# Patient Record
Sex: Female | Born: 1960 | Race: Black or African American | Hispanic: No | Smoking: Never smoker
Health system: Southern US, Community
[De-identification: ages and names within clinical notes are randomized; demographics above are authoritative.]

## PROBLEM LIST (undated history)

## (undated) DIAGNOSIS — N920 Excessive and frequent menstruation with regular cycle: Secondary | ICD-10-CM

## (undated) DIAGNOSIS — E785 Hyperlipidemia, unspecified: Secondary | ICD-10-CM

## (undated) DIAGNOSIS — I10 Essential (primary) hypertension: Secondary | ICD-10-CM

## (undated) DIAGNOSIS — E669 Obesity, unspecified: Secondary | ICD-10-CM

## (undated) DIAGNOSIS — E119 Type 2 diabetes mellitus without complications: Secondary | ICD-10-CM

## (undated) DIAGNOSIS — I809 Phlebitis and thrombophlebitis of unspecified site: Secondary | ICD-10-CM

## (undated) DIAGNOSIS — I8390 Asymptomatic varicose veins of unspecified lower extremity: Secondary | ICD-10-CM

## (undated) HISTORY — DX: Hyperlipidemia, unspecified: E78.5

## (undated) HISTORY — DX: Excessive and frequent menstruation with regular cycle: N92.0

## (undated) HISTORY — DX: Obesity, unspecified: E66.9

## (undated) HISTORY — DX: Phlebitis and thrombophlebitis of unspecified site: I80.9

## (undated) HISTORY — DX: Essential (primary) hypertension: I10

## (undated) HISTORY — DX: Asymptomatic varicose veins of unspecified lower extremity: I83.90

## (undated) HISTORY — DX: Type 2 diabetes mellitus without complications: E11.9

## (undated) HISTORY — PX: TOTAL ABDOMINAL HYSTERECTOMY: SHX209

---

## 1997-07-24 ENCOUNTER — Other Ambulatory Visit: Admission: RE | Admit: 1997-07-24 | Discharge: 1997-07-24 | Payer: Self-pay | Admitting: Obstetrics and Gynecology

## 1998-01-10 ENCOUNTER — Encounter: Payer: Self-pay | Admitting: Family Medicine

## 1998-01-10 ENCOUNTER — Ambulatory Visit (HOSPITAL_COMMUNITY): Admission: RE | Admit: 1998-01-10 | Discharge: 1998-01-10 | Payer: Self-pay | Admitting: Family Medicine

## 1998-11-12 ENCOUNTER — Encounter (INDEPENDENT_AMBULATORY_CARE_PROVIDER_SITE_OTHER): Payer: Self-pay

## 1998-11-12 ENCOUNTER — Other Ambulatory Visit: Admission: RE | Admit: 1998-11-12 | Discharge: 1998-11-12 | Payer: Self-pay | Admitting: Obstetrics and Gynecology

## 1998-12-12 ENCOUNTER — Encounter (INDEPENDENT_AMBULATORY_CARE_PROVIDER_SITE_OTHER): Payer: Self-pay

## 1998-12-12 ENCOUNTER — Inpatient Hospital Stay (HOSPITAL_COMMUNITY): Admission: RE | Admit: 1998-12-12 | Discharge: 1998-12-14 | Payer: Self-pay | Admitting: Obstetrics and Gynecology

## 1999-02-09 ENCOUNTER — Encounter: Admission: RE | Admit: 1999-02-09 | Discharge: 1999-05-10 | Payer: Self-pay | Admitting: Family Medicine

## 2006-07-27 ENCOUNTER — Encounter: Admission: RE | Admit: 2006-07-27 | Discharge: 2006-07-27 | Payer: Self-pay | Admitting: Internal Medicine

## 2007-08-18 ENCOUNTER — Encounter: Admission: RE | Admit: 2007-08-18 | Discharge: 2007-08-18 | Payer: Self-pay | Admitting: Internal Medicine

## 2008-09-18 ENCOUNTER — Encounter: Admission: RE | Admit: 2008-09-18 | Discharge: 2008-09-18 | Payer: Self-pay | Admitting: Internal Medicine

## 2009-10-06 ENCOUNTER — Encounter: Admission: RE | Admit: 2009-10-06 | Discharge: 2009-10-06 | Payer: Self-pay | Admitting: Internal Medicine

## 2010-03-15 ENCOUNTER — Encounter: Payer: Self-pay | Admitting: Internal Medicine

## 2010-09-15 ENCOUNTER — Other Ambulatory Visit: Payer: Self-pay | Admitting: Internal Medicine

## 2010-09-15 DIAGNOSIS — Z1231 Encounter for screening mammogram for malignant neoplasm of breast: Secondary | ICD-10-CM

## 2010-10-13 ENCOUNTER — Ambulatory Visit
Admission: RE | Admit: 2010-10-13 | Discharge: 2010-10-13 | Disposition: A | Discharge status: Home / Self Care | Payer: 59 | Source: Ambulatory Visit | Attending: Internal Medicine | Admitting: Internal Medicine

## 2010-10-13 DIAGNOSIS — Z1231 Encounter for screening mammogram for malignant neoplasm of breast: Secondary | ICD-10-CM

## 2011-09-27 ENCOUNTER — Other Ambulatory Visit: Payer: Self-pay | Admitting: Internal Medicine

## 2011-09-27 DIAGNOSIS — Z1231 Encounter for screening mammogram for malignant neoplasm of breast: Secondary | ICD-10-CM

## 2011-10-14 ENCOUNTER — Ambulatory Visit: Payer: 59

## 2011-11-04 ENCOUNTER — Ambulatory Visit
Admission: RE | Admit: 2011-11-04 | Discharge: 2011-11-04 | Disposition: A | Discharge status: Home / Self Care | Payer: BC Managed Care – PPO | Source: Ambulatory Visit | Attending: Internal Medicine | Admitting: Internal Medicine

## 2011-11-04 DIAGNOSIS — Z1231 Encounter for screening mammogram for malignant neoplasm of breast: Secondary | ICD-10-CM

## 2012-02-08 ENCOUNTER — Other Ambulatory Visit (HOSPITAL_COMMUNITY): Payer: Self-pay | Admitting: Internal Medicine

## 2012-02-08 DIAGNOSIS — I809 Phlebitis and thrombophlebitis of unspecified site: Secondary | ICD-10-CM

## 2012-02-08 DIAGNOSIS — I8390 Asymptomatic varicose veins of unspecified lower extremity: Secondary | ICD-10-CM

## 2012-02-28 ENCOUNTER — Encounter (HOSPITAL_COMMUNITY): Payer: BC Managed Care – PPO

## 2012-03-08 ENCOUNTER — Ambulatory Visit (HOSPITAL_COMMUNITY)
Admission: RE | Admit: 2012-03-08 | Discharge: 2012-03-08 | Disposition: A | Discharge status: Home / Self Care | Payer: BC Managed Care – PPO | Source: Ambulatory Visit | Attending: Internal Medicine | Admitting: Internal Medicine

## 2012-03-08 DIAGNOSIS — I809 Phlebitis and thrombophlebitis of unspecified site: Secondary | ICD-10-CM | POA: Insufficient documentation

## 2012-03-08 DIAGNOSIS — I8390 Asymptomatic varicose veins of unspecified lower extremity: Secondary | ICD-10-CM

## 2012-03-08 NOTE — Progress Notes (Signed)
BLE venous duplex doppler completed for venous insufficiency. Positive for left GSV VI. Chauncy Lean

## 2012-08-31 ENCOUNTER — Other Ambulatory Visit: Payer: Self-pay | Admitting: Internal Medicine

## 2012-08-31 DIAGNOSIS — Z1231 Encounter for screening mammogram for malignant neoplasm of breast: Secondary | ICD-10-CM

## 2012-10-03 ENCOUNTER — Other Ambulatory Visit: Payer: Self-pay

## 2012-10-03 DIAGNOSIS — Z1231 Encounter for screening mammogram for malignant neoplasm of breast: Secondary | ICD-10-CM

## 2012-11-07 ENCOUNTER — Ambulatory Visit
Admission: RE | Admit: 2012-11-07 | Discharge: 2012-11-07 | Disposition: A | Discharge status: Home / Self Care | Payer: BC Managed Care – PPO | Source: Ambulatory Visit

## 2012-11-07 DIAGNOSIS — Z1231 Encounter for screening mammogram for malignant neoplasm of breast: Secondary | ICD-10-CM

## 2013-10-04 ENCOUNTER — Other Ambulatory Visit: Payer: Self-pay

## 2013-10-04 DIAGNOSIS — Z1231 Encounter for screening mammogram for malignant neoplasm of breast: Secondary | ICD-10-CM

## 2013-11-08 ENCOUNTER — Ambulatory Visit: Payer: BC Managed Care – PPO

## 2013-12-03 ENCOUNTER — Ambulatory Visit
Admission: RE | Admit: 2013-12-03 | Discharge: 2013-12-03 | Disposition: A | Discharge status: Home / Self Care | Payer: Commercial Managed Care - PPO | Source: Ambulatory Visit

## 2013-12-03 DIAGNOSIS — Z1231 Encounter for screening mammogram for malignant neoplasm of breast: Secondary | ICD-10-CM

## 2014-09-05 ENCOUNTER — Other Ambulatory Visit: Payer: Self-pay

## 2014-10-04 ENCOUNTER — Encounter: Payer: Self-pay | Admitting: Internal Medicine

## 2014-11-27 ENCOUNTER — Other Ambulatory Visit: Payer: Self-pay

## 2014-11-27 DIAGNOSIS — Z1231 Encounter for screening mammogram for malignant neoplasm of breast: Secondary | ICD-10-CM

## 2014-12-25 ENCOUNTER — Ambulatory Visit: Payer: Commercial Managed Care - PPO

## 2014-12-26 ENCOUNTER — Ambulatory Visit
Admission: RE | Admit: 2014-12-26 | Discharge: 2014-12-26 | Disposition: A | Discharge status: Home / Self Care | Payer: Commercial Managed Care - PPO | Source: Ambulatory Visit

## 2014-12-26 DIAGNOSIS — Z1231 Encounter for screening mammogram for malignant neoplasm of breast: Secondary | ICD-10-CM

## 2015-12-01 ENCOUNTER — Other Ambulatory Visit: Payer: Self-pay | Admitting: Internal Medicine

## 2015-12-01 DIAGNOSIS — Z1231 Encounter for screening mammogram for malignant neoplasm of breast: Secondary | ICD-10-CM

## 2015-12-29 ENCOUNTER — Ambulatory Visit
Admission: RE | Admit: 2015-12-29 | Discharge: 2015-12-29 | Disposition: A | Discharge status: Home / Self Care | Payer: Commercial Managed Care - PPO | Source: Ambulatory Visit | Attending: Internal Medicine | Admitting: Internal Medicine

## 2015-12-29 DIAGNOSIS — Z1231 Encounter for screening mammogram for malignant neoplasm of breast: Secondary | ICD-10-CM

## 2016-03-02 DIAGNOSIS — N951 Menopausal and female climacteric states: Secondary | ICD-10-CM | POA: Diagnosis not present

## 2016-03-02 DIAGNOSIS — I10 Essential (primary) hypertension: Secondary | ICD-10-CM | POA: Diagnosis not present

## 2016-03-02 DIAGNOSIS — E1165 Type 2 diabetes mellitus with hyperglycemia: Secondary | ICD-10-CM | POA: Diagnosis not present

## 2016-05-06 DIAGNOSIS — B372 Candidiasis of skin and nail: Secondary | ICD-10-CM | POA: Diagnosis not present

## 2016-05-17 DIAGNOSIS — R001 Bradycardia, unspecified: Secondary | ICD-10-CM | POA: Diagnosis not present

## 2016-05-17 DIAGNOSIS — I119 Hypertensive heart disease without heart failure: Secondary | ICD-10-CM | POA: Diagnosis not present

## 2016-05-17 DIAGNOSIS — I080 Rheumatic disorders of both mitral and aortic valves: Secondary | ICD-10-CM | POA: Diagnosis not present

## 2016-06-08 DIAGNOSIS — E1165 Type 2 diabetes mellitus with hyperglycemia: Secondary | ICD-10-CM | POA: Diagnosis not present

## 2016-06-08 DIAGNOSIS — N951 Menopausal and female climacteric states: Secondary | ICD-10-CM | POA: Diagnosis not present

## 2016-06-08 DIAGNOSIS — I10 Essential (primary) hypertension: Secondary | ICD-10-CM | POA: Diagnosis not present

## 2016-08-26 DIAGNOSIS — L603 Nail dystrophy: Secondary | ICD-10-CM | POA: Diagnosis not present

## 2016-08-26 DIAGNOSIS — B351 Tinea unguium: Secondary | ICD-10-CM | POA: Diagnosis not present

## 2016-10-21 DIAGNOSIS — I10 Essential (primary) hypertension: Secondary | ICD-10-CM | POA: Diagnosis not present

## 2016-10-21 DIAGNOSIS — Z0000 Encounter for general adult medical examination without abnormal findings: Secondary | ICD-10-CM | POA: Diagnosis not present

## 2016-10-21 DIAGNOSIS — E1165 Type 2 diabetes mellitus with hyperglycemia: Secondary | ICD-10-CM | POA: Diagnosis not present

## 2016-11-02 DIAGNOSIS — Z8371 Family history of colonic polyps: Secondary | ICD-10-CM | POA: Diagnosis not present

## 2016-11-02 DIAGNOSIS — R194 Change in bowel habit: Secondary | ICD-10-CM | POA: Diagnosis not present

## 2016-11-16 DIAGNOSIS — K5904 Chronic idiopathic constipation: Secondary | ICD-10-CM | POA: Diagnosis not present

## 2016-11-30 ENCOUNTER — Other Ambulatory Visit: Payer: Self-pay | Admitting: Internal Medicine

## 2016-11-30 DIAGNOSIS — Z1231 Encounter for screening mammogram for malignant neoplasm of breast: Secondary | ICD-10-CM

## 2016-12-27 DIAGNOSIS — H2513 Age-related nuclear cataract, bilateral: Secondary | ICD-10-CM | POA: Diagnosis not present

## 2016-12-27 DIAGNOSIS — E119 Type 2 diabetes mellitus without complications: Secondary | ICD-10-CM | POA: Diagnosis not present

## 2016-12-27 DIAGNOSIS — H31091 Other chorioretinal scars, right eye: Secondary | ICD-10-CM | POA: Diagnosis not present

## 2016-12-29 ENCOUNTER — Ambulatory Visit: Payer: Commercial Managed Care - PPO

## 2017-01-05 ENCOUNTER — Ambulatory Visit
Admission: RE | Admit: 2017-01-05 | Discharge: 2017-01-05 | Disposition: A | Discharge status: Home / Self Care | Payer: Commercial Managed Care - PPO | Source: Ambulatory Visit | Attending: Internal Medicine | Admitting: Internal Medicine

## 2017-01-05 DIAGNOSIS — Z1231 Encounter for screening mammogram for malignant neoplasm of breast: Secondary | ICD-10-CM | POA: Diagnosis not present

## 2017-02-24 DIAGNOSIS — E785 Hyperlipidemia, unspecified: Secondary | ICD-10-CM | POA: Diagnosis not present

## 2017-02-24 DIAGNOSIS — E1165 Type 2 diabetes mellitus with hyperglycemia: Secondary | ICD-10-CM | POA: Diagnosis not present

## 2017-02-24 DIAGNOSIS — I10 Essential (primary) hypertension: Secondary | ICD-10-CM | POA: Diagnosis not present

## 2017-02-28 DIAGNOSIS — I119 Hypertensive heart disease without heart failure: Secondary | ICD-10-CM | POA: Diagnosis not present

## 2017-02-28 DIAGNOSIS — I080 Rheumatic disorders of both mitral and aortic valves: Secondary | ICD-10-CM | POA: Diagnosis not present

## 2017-02-28 DIAGNOSIS — R001 Bradycardia, unspecified: Secondary | ICD-10-CM | POA: Diagnosis not present

## 2017-03-16 DIAGNOSIS — K5900 Constipation, unspecified: Secondary | ICD-10-CM | POA: Diagnosis not present

## 2017-03-16 DIAGNOSIS — R195 Other fecal abnormalities: Secondary | ICD-10-CM | POA: Diagnosis not present

## 2017-03-16 DIAGNOSIS — Z8371 Family history of colonic polyps: Secondary | ICD-10-CM | POA: Diagnosis not present

## 2017-03-16 DIAGNOSIS — Z86010 Personal history of colonic polyps: Secondary | ICD-10-CM | POA: Diagnosis not present

## 2017-05-13 DIAGNOSIS — D122 Benign neoplasm of ascending colon: Secondary | ICD-10-CM | POA: Diagnosis not present

## 2017-05-13 DIAGNOSIS — Z86010 Personal history of colonic polyps: Secondary | ICD-10-CM | POA: Diagnosis not present

## 2017-05-13 DIAGNOSIS — D123 Benign neoplasm of transverse colon: Secondary | ICD-10-CM | POA: Diagnosis not present

## 2017-05-31 DIAGNOSIS — I10 Essential (primary) hypertension: Secondary | ICD-10-CM | POA: Diagnosis not present

## 2017-05-31 DIAGNOSIS — E1165 Type 2 diabetes mellitus with hyperglycemia: Secondary | ICD-10-CM | POA: Diagnosis not present

## 2017-08-02 DIAGNOSIS — G4700 Insomnia, unspecified: Secondary | ICD-10-CM | POA: Diagnosis not present

## 2017-08-02 DIAGNOSIS — N951 Menopausal and female climacteric states: Secondary | ICD-10-CM | POA: Diagnosis not present

## 2017-08-02 DIAGNOSIS — Z1389 Encounter for screening for other disorder: Secondary | ICD-10-CM | POA: Diagnosis not present

## 2017-09-08 DIAGNOSIS — E1165 Type 2 diabetes mellitus with hyperglycemia: Secondary | ICD-10-CM | POA: Diagnosis not present

## 2017-09-08 DIAGNOSIS — I10 Essential (primary) hypertension: Secondary | ICD-10-CM | POA: Diagnosis not present

## 2017-09-08 DIAGNOSIS — N951 Menopausal and female climacteric states: Secondary | ICD-10-CM | POA: Diagnosis not present

## 2017-09-08 LAB — BASIC METABOLIC PANEL
BUN: 11 (ref 4–21)
Creatinine: 0.8 (ref 0.5–1.1)
Glucose: 85
Potassium: 4.2 (ref 3.4–5.3)
Sodium: 143 (ref 137–147)

## 2017-09-08 LAB — HEMOGLOBIN A1C: Hemoglobin A1C: 6.6

## 2017-11-28 ENCOUNTER — Other Ambulatory Visit: Payer: Self-pay | Admitting: Internal Medicine

## 2017-11-28 DIAGNOSIS — Z1231 Encounter for screening mammogram for malignant neoplasm of breast: Secondary | ICD-10-CM

## 2017-12-12 ENCOUNTER — Encounter: Payer: Self-pay | Admitting: Internal Medicine

## 2017-12-12 DIAGNOSIS — I10 Essential (primary) hypertension: Secondary | ICD-10-CM | POA: Insufficient documentation

## 2017-12-15 ENCOUNTER — Encounter: Payer: Self-pay | Admitting: Internal Medicine

## 2017-12-15 ENCOUNTER — Ambulatory Visit (INDEPENDENT_AMBULATORY_CARE_PROVIDER_SITE_OTHER): Payer: Commercial Managed Care - PPO | Admitting: Internal Medicine

## 2017-12-15 VITALS — BP 126/88 | HR 56 | Temp 98.3°F | Ht 63.25 in | Wt 218.8 lb

## 2017-12-15 DIAGNOSIS — Z7982 Long term (current) use of aspirin: Secondary | ICD-10-CM | POA: Diagnosis not present

## 2017-12-15 DIAGNOSIS — E1165 Type 2 diabetes mellitus with hyperglycemia: Secondary | ICD-10-CM | POA: Diagnosis not present

## 2017-12-15 DIAGNOSIS — I10 Essential (primary) hypertension: Secondary | ICD-10-CM | POA: Diagnosis not present

## 2017-12-15 DIAGNOSIS — Z0000 Encounter for general adult medical examination without abnormal findings: Secondary | ICD-10-CM

## 2017-12-15 LAB — POCT URINALYSIS DIPSTICK
Bilirubin, UA: NEGATIVE
Glucose, UA: NEGATIVE
Ketones, UA: NEGATIVE
Protein, UA: NEGATIVE
Spec Grav, UA: 1.015 (ref 1.010–1.025)
Urobilinogen, UA: 0.2 E.U./dL
pH, UA: 7 (ref 5.0–8.0)

## 2017-12-15 LAB — POCT UA - MICROALBUMIN
Albumin/Creatinine Ratio, Urine, POC: 30
Creatinine, POC: 100 mg/dL
Microalbumin Ur, POC: 10 mg/L

## 2017-12-15 NOTE — Progress Notes (Signed)
Subjective:     Patient ID: Katelyn Schmidt , female    DOB: 19-May-1960 , 57 y.o.   MRN: [id number]  No LMP recorded. Patient is postmenopausal..  Negative for: breast discharge, breast lump(s), breast pain and breast self exam. Associated symptoms include abnormal vaginal bleeding. Pertinent negatives include abnormal bleeding (hematology), anxiety, decreased libido, depression, difficulty falling sleep, dyspareunia, history of infertility, nocturia, sexual dysfunction, sleep disturbances, urinary incontinence, urinary urgency, vaginal discharge and vaginal itching. Diet regular.The patient states her exercise level is  MINIMAL.  . The patient's tobacco use is:  Social History   Tobacco Use  Smoking Status Never Smoker  Smokeless Tobacco Never Used  . She has been exposed to passive smoke. The patient's alcohol use is:  Social History   Substance and Sexual Activity  Alcohol Use No   SHE IS HERE TODAY FOR A FULL PHYSICAL EXAMINATION. SHE HAS NO SPECIFIC CONCERNS OR COMPLAINTS AT THIS TIME.   Diabetes  She presents for her follow-up diabetic visit. She has type 2 diabetes mellitus. There are no hypoglycemic associated symptoms. There are no diabetic associated symptoms. Weakness:  There are no hypoglycemic complications. Symptoms are stable. Risk factors for coronary artery disease include diabetes mellitus, dyslipidemia, hypertension, post-menopausal and sedentary lifestyle. She is compliant with treatment most of the time.  Hypertension  This is a chronic problem. The current episode started more than 1 year ago. The problem has been gradually improving since onset. The problem is controlled.   SHE REPORTS COMPLIANCE WITH MEDICATIONS.  Past Medical History:  Diagnosis Date  . Diabetes mellitus, type 2 ([place])   . Dyslipidemia   . Hypertension   . Menorrhagia    fibroids  . Obesity   . Phlebitis   . Varicose veins       Current Outpatient Medications:  .  amLODipine (NORVASC) 10  MG tablet, Take 10 mg by mouth daily., Disp: , Rfl:  .  aspirin 81 MG tablet, Take 81 mg by mouth daily., Disp: , Rfl:  .  Cholecalciferol (VITAMIN D-3 PO), Take 1 tablet by mouth daily., Disp: , Rfl:  .  metFORMIN (GLUCOPHAGE) 500 MG tablet, Take 500 mg by mouth 2 (two) times daily with a meal. , Disp: , Rfl:  .  Multiple Vitamins-Minerals (MULTIVITAMIN PO), Take 1 tablet by mouth daily., Disp: , Rfl:  .  Progesterone 50 MG SUPP, Place 1 suppository vaginally at bedtime., Disp: , Rfl:  .  rosuvastatin (CRESTOR) 10 MG tablet, Take 10 mg by mouth daily., Disp: , Rfl:  .  telmisartan-hydrochlorothiazide (MICARDIS HCT) 80-25 MG tablet, Take 1 tablet by mouth daily., Disp: , Rfl:  .  vitamin C (ASCORBIC ACID) 500 MG tablet, Take 500 mg by mouth daily., Disp: , Rfl:    No Known Allergies   Review of Systems  Constitutional: Negative.   HENT: Negative.   Eyes: Negative.   Respiratory: Negative.   Cardiovascular: Negative.   Gastrointestinal: Negative.   Endocrine: Negative.   Genitourinary: Negative.   Musculoskeletal: Negative.   Skin: Negative.   Allergic/Immunologic: Negative.   Neurological: Negative.  Weakness:   Hematological: Negative.   Psychiatric/Behavioral: Negative.      Today's Vitals   12/15/17 0934  BP: 126/88  Pulse: (!) 56  Temp: 98.3 F (36.8 C)  TempSrc: Oral  Weight: 218 lb 12.8 oz (99.2 kg)  Height: 5' 3.25" (1.607 m)   Body mass index is 38.45 kg/m.   Objective:  Physical Exam  Constitutional: She is  oriented to person, place, and time. She appears well-developed and well-nourished.  HENT:  Head: Normocephalic and atraumatic.  Right Ear: External ear normal.  Left Ear: External ear normal.  Nose: Nose normal.  Mouth/Throat: Oropharynx is clear and moist.  Eyes: Pupils are equal, round, and reactive to light. Conjunctivae and EOM are normal.  Neck: Normal range of motion. Neck supple.  Cardiovascular: Normal rate, regular rhythm, normal heart sounds  and intact distal pulses.  Pulses:      Dorsalis pedis pulses are 2+ on the right side, and 2+ on the left side.       Posterior tibial pulses are 2+ on the right side, and 2+ on the left side.  Pulmonary/Chest: Effort normal and breath sounds normal. Right breast exhibits no inverted nipple, no mass, no nipple discharge, no skin change and no tenderness. Left breast exhibits no inverted nipple, no mass, no nipple discharge, no skin change and no tenderness.  Abdominal: Soft. Bowel sounds are normal.  Genitourinary:  Genitourinary Comments: deferred  Musculoskeletal: Normal range of motion.  Feet:  Right Foot:  Protective Sensation: 5 sites tested. 5 sites sensed.  Skin Integrity: Positive for callus.  Left Foot:  Protective Sensation: 5 sites tested. 5 sites sensed.  Skin Integrity: Positive for callus.  Neurological: She is alert and oriented to person, place, and time.  Skin: Skin is warm and dry.  Psychiatric: She has a normal mood and affect. Her behavior is normal.  Nursing note and vitals reviewed.       Assessment And Plan:     1. Routine general medical examination at health care facility  A FULL EXAM WAS PERFORMED. IMPORTANCE OF MONTHLY SELF BREAST EXAMS WAS DISCUSSED WITH THE PATIENT.  PATIENT HAS BEEN ADVISED TO GET 30-45 MINUTES REGULAR EXERCISE NO LESS THAN FOUR TO FIVE DAYS PER WEEK - BOTH WEIGHTBEARING EXERCISES AND AEROBIC ARE RECOMMENDED.  SHE IS ADVISED TO FOLLOW A HEALTHY DIET WITH AT LEAST SIX FRUITS/VEGGIES PER DAY, DECREASE INTAKE OF RED MEAT, AND TO INCREASE FISH INTAKE TO TWO DAYS PER WEEK.  MEATS/FISH SHOULD NOT BE FRIED, BAKED OR BROILED IS PREFERABLE.  I SUGGEST WEARING SPF 50 SUNSCREEN ON EXPOSED PARTS AND ESPECIALLY WHEN IN THE DIRECT SUNLIGHT FOR AN EXTENDED PERIOD OF TIME.  PLEASE AVOID FAST FOOD RESTAURANTS AND INCREASE YOUR WATER INTAKE.   2. Uncontrolled type 2 diabetes mellitus with hyperglycemia ([facility])  Diabetic foot exam was performed.  IT IS  IMPORTANT TO MAINTAIN OPTIMAL BLOOD SUGAR CONTROL TO REDUCE YOUR RISK OF SERIOUS DIABETES COMPLICATIONS INCLUDING EYE AND KIDNEY DISEASE.  BLOOD SUGARS SHOULD BE LESS THAN 110 BEFORE MEALS AND LESS THAN 140 TWO HOURS AFTER MEALS.  PLEASE TEST BS THREE TIMES DAILY. BRING YOUR BLOOD SUGAR READINGS FOR REVIEW.  PLEASE MAINTAIN A HEALTHY, WELL BALANCED DIET FREE OF PROCESSED FOODS AND REFINED SUGARS.  MAINTAIN YOUR REGULAR EXERCISE REGIMEN.  PLS NOTIFY OFFICE FOR UNCONTROLLED NAUSEA, VOMITING, DIARRHEA, POOR ORAL INTAKE AND FOR CONSISTENT BS READINGS ABOVE 350.   3. Essential hypertension  WELL CONTROLLED. SHE WILL CONTINUE WITH CURRENT MEDS. SHE IS ENCOURAGED TO LIMIT HER SALT INTAKE.    - POCT Urinalysis Dipstick (03559) - POCT UA - Microalbumin        Maximino Greenland, MD

## 2017-12-16 LAB — CMP14+EGFR
ALT: 18 IU/L (ref 0–32)
AST: 15 IU/L (ref 0–40)
Albumin/Globulin Ratio: 1.7 (ref 1.2–2.2)
Albumin: 4.3 g/dL (ref 3.5–5.5)
Alkaline Phosphatase: 96 IU/L (ref 39–117)
BUN/Creatinine Ratio: 13 (ref 9–23)
BUN: 11 mg/dL (ref 6–24)
Bilirubin Total: 0.4 mg/dL (ref 0.0–1.2)
CO2: 24 mmol/L (ref 20–29)
Calcium: 10.6 mg/dL — ABNORMAL HIGH (ref 8.7–10.2)
Chloride: 104 mmol/L (ref 96–106)
Creatinine, Ser: 0.85 mg/dL (ref 0.57–1.00)
GFR calc Af Amer: 89 mL/min/{1.73_m2} (ref >59)
GFR calc non Af Amer: 77 mL/min/{1.73_m2} (ref >59)
Globulin, Total: 2.6 g/dL (ref 1.5–4.5)
Glucose: 105 mg/dL — ABNORMAL HIGH (ref 65–99)
Potassium: 4.1 mmol/L (ref 3.5–5.2)
Sodium: 142 mmol/L (ref 134–144)
Total Protein: 6.9 g/dL (ref 6.0–8.5)

## 2017-12-16 LAB — CBC WITH DIFFERENTIAL/PLATELET
Basophils Absolute: 0.1 10*3/uL (ref 0.0–0.2)
Basos: 1 %
EOS (ABSOLUTE): 0.2 10*3/uL (ref 0.0–0.4)
Eos: 4 %
Hematocrit: 44.3 % (ref 34.0–46.6)
Hemoglobin: 13.8 g/dL (ref 11.1–15.9)
Immature Grans (Abs): 0 10*3/uL (ref 0.0–0.1)
Immature Granulocytes: 1 %
Lymphocytes Absolute: 2.5 10*3/uL (ref 0.7–3.1)
Lymphs: 37 %
MCH: 25 pg — ABNORMAL LOW (ref 26.6–33.0)
MCHC: 31.2 g/dL — ABNORMAL LOW (ref 31.5–35.7)
MCV: 80 fL (ref 79–97)
Monocytes Absolute: 0.6 10*3/uL (ref 0.1–0.9)
Monocytes: 9 %
Neutrophils Absolute: 3.1 10*3/uL (ref 1.4–7.0)
Neutrophils: 48 %
Platelets: 319 10*3/uL (ref 150–450)
RBC: 5.53 x10E6/uL — ABNORMAL HIGH (ref 3.77–5.28)
RDW: 14.7 % (ref 12.3–15.4)
WBC: 6.6 10*3/uL (ref 3.4–10.8)

## 2017-12-16 LAB — LIPID PANEL
Chol/HDL Ratio: 3.2 ratio (ref 0.0–4.4)
Cholesterol, Total: 144 mg/dL (ref 100–199)
HDL: 45 mg/dL (ref >39)
LDL Calculated: 79 mg/dL (ref 0–99)
Triglycerides: 100 mg/dL (ref 0–149)
VLDL Cholesterol Cal: 20 mg/dL (ref 5–40)

## 2017-12-16 LAB — TSH: TSH: 1.68 u[IU]/mL (ref 0.450–4.500)

## 2017-12-16 LAB — HEMOGLOBIN A1C
Est. average glucose Bld gHb Est-mCnc: 143 mg/dL
Hgb A1c MFr Bld: 6.6 % — ABNORMAL HIGH (ref 4.8–5.6)

## 2017-12-18 ENCOUNTER — Encounter: Payer: Self-pay | Admitting: Internal Medicine

## 2017-12-18 DIAGNOSIS — E669 Obesity, unspecified: Secondary | ICD-10-CM | POA: Insufficient documentation

## 2017-12-18 DIAGNOSIS — E1165 Type 2 diabetes mellitus with hyperglycemia: Secondary | ICD-10-CM | POA: Insufficient documentation

## 2017-12-18 NOTE — Progress Notes (Signed)
Your blood count is normal. Your liver and kidney function are normal. Your calcium levels are slightly elevated. Your hba1c is 6.6. 6, this is great. Your thyroid function is normal. Your cholesterol is great. Please incorporate more exercise into your daily routine.

## 2017-12-19 NOTE — Addendum Note (Signed)
Addended by: Maximino Greenland on: 12/19/2017 06:43 PM   Modules accepted: Orders

## 2017-12-28 DIAGNOSIS — H2513 Age-related nuclear cataract, bilateral: Secondary | ICD-10-CM | POA: Diagnosis not present

## 2017-12-28 DIAGNOSIS — H31091 Other chorioretinal scars, right eye: Secondary | ICD-10-CM | POA: Diagnosis not present

## 2017-12-28 DIAGNOSIS — E119 Type 2 diabetes mellitus without complications: Secondary | ICD-10-CM | POA: Diagnosis not present

## 2017-12-28 LAB — HM DIABETES EYE EXAM

## 2018-01-09 ENCOUNTER — Ambulatory Visit
Admission: RE | Admit: 2018-01-09 | Discharge: 2018-01-09 | Disposition: A | Discharge status: Home / Self Care | Payer: Commercial Managed Care - PPO | Source: Ambulatory Visit | Attending: Internal Medicine | Admitting: Internal Medicine

## 2018-01-09 DIAGNOSIS — Z1231 Encounter for screening mammogram for malignant neoplasm of breast: Secondary | ICD-10-CM | POA: Diagnosis not present

## 2018-02-27 ENCOUNTER — Other Ambulatory Visit: Payer: Self-pay | Admitting: Internal Medicine

## 2018-03-27 ENCOUNTER — Other Ambulatory Visit: Payer: Commercial Managed Care - PPO

## 2018-03-27 ENCOUNTER — Ambulatory Visit: Payer: Commercial Managed Care - PPO

## 2018-03-28 ENCOUNTER — Encounter: Payer: Self-pay | Admitting: Internal Medicine

## 2018-03-28 ENCOUNTER — Ambulatory Visit: Payer: Commercial Managed Care - PPO | Admitting: Internal Medicine

## 2018-03-28 VITALS — BP 102/80 | HR 55 | Temp 98.1°F | Ht 63.0 in | Wt 227.0 lb

## 2018-03-28 DIAGNOSIS — R319 Hematuria, unspecified: Secondary | ICD-10-CM | POA: Diagnosis not present

## 2018-03-28 LAB — POCT URINALYSIS DIPSTICK
Bilirubin, UA: NEGATIVE
Glucose, UA: NEGATIVE
Ketones, UA: NEGATIVE
Leukocytes, UA: NEGATIVE
Nitrite, UA: NEGATIVE
Protein, UA: NEGATIVE
Spec Grav, UA: 1.02 (ref 1.010–1.025)
Urobilinogen, UA: 0.2 E.U./dL
pH, UA: 6 (ref 5.0–8.0)

## 2018-03-28 MED ORDER — NITROFURANTOIN MONOHYD MACRO 100 MG PO CAPS: 100.0000 mg | ORAL_CAPSULE | Freq: Two times a day (BID) | ORAL | 0 refills | Status: DC

## 2018-03-28 NOTE — Patient Instructions (Signed)
Hemorrhagic Cystitis  Hemorrhagic cystitis is bleeding from damage to the inner lining of the bladder. This condition results when the inner lining of the bladder (transitional epithelium) is damaged along with the blood vessels that supply the area. Hemorrhagic cystitis may make it difficult or painful to pass urine.  What are the causes?  This condition may be caused by:  · Damage from certain cancer treatments. This is the most common cause. It can result from:  ? Radiation treatment that involves the bladder.  ? Chemotherapy drugs used to treat certain cancers or to treat people who have a bone marrow transplant (cyclophosphamide and ifosfamide).  · Infections with bacteria or viruses, especially in people with a weak body defense system (immune system).  Rare causes of the condition include:  · Other drugs, including penicillin drugs and a type of steroid (danazol).  · Exposure to toxic chemicals used in dyes, markers, shoe polish, or pesticides.  What are the signs or symptoms?  The main sign of this condition is blood in the urine (hematuria). This can range from very mild to severe.  · Mild hematuria can include microscopic bleeding that does not change the color of your urine.  · Severe hematuria can cause you to have urine with bright red blood or blood clots in it. In some cases, severe hematuria can cause large clots that fill the bladder and block urine flow (urinary obstruction).  Hemorrhagic cystitis may also cause symptoms such as:  · An urgent or frequent need to pass urine.  · Pain when passing urine.  · Lower belly pain and fullness.  · Urinary obstruction.  How is this diagnosed?  This condition may be diagnosed based on:  · Your symptoms and medical history.  · A physical exam.  · Tests, such as:  ? Urine tests to check for blood or signs of infection.  ? Blood tests to check for signs of infection and a low red blood cell count due to bleeding.  ? Imaging tests of the bladder, such as  ultrasound, CT scan, or MRI.  ? A procedure to examine the inside of your bladder using a flexible scope (cystoscopy).  How is this treated?  Treatment depends on the cause of the condition and how severe the bleeding is. If you are being treated with chemotherapy drugs, you may be given other medicines to reduce the risk for this condition during treatment. Other treatments may include:  · Removing your exposure to substances that are causing the condition, such as a chemical toxin or a medicine.  · Taking antibiotic or antiviral medicine if the condition is caused by an infection.  You may also be treated for hematuria. This can include:  · Fluids (hydration), bed rest, and observation. These methods may be all that is needed if you are able to pass urine and have no blood clots.  · Placing a flexible tube (catheter) into the bladder to continuously flush out (irrigate) the bladder with sterile saline solution. This may be needed if clots are passing or if bleeding is continuing.  · Medicine to reduce bleeding.  · A cystoscopy to remove clots if clots are filling the bladder. This may also include a procedure to stop bleeding (coagulation).  · A transfusion to replace blood loss.  You may need surgery to stop bleeding or to remove the bladder if other treatments have not helped.  Follow these instructions at home:    · If you had surgery, your health   care provider will give you instructions for taking care of yourself at home after your procedure. Follow these instructions carefully.  · Take over-the-counter and prescription medicines only as told by your health care provider.  · If you were prescribed an antibiotic medicine, take it as told by your health care provider. Do not stop using the antibiotic even if you start to feel better.  · Return to your normal activities as told by your health care provider. Ask your health care provider what activities are safe for you.  · Drink enough fluid to keep your urine  pale yellow.  · Keep all follow-up visits as told by your health care provider. This is important.  Contact a health care provider if you have:  · Chills or a fever.  · Blood in your urine.  · An urgent or frequent need to pass urine.  · Pain when passing urine.  Get help right away if you:  · Have bright red blood or clots in your urine.  · Are unable to pass urine.  Summary  · Hemorrhagic cystitis is bleeding caused by damage to the inner lining of your bladder.  · Hemorrhagic cystitis may make it difficult or painful to pass urine.  · This condition may be caused by damage from infections, radiation therapy, or chemotherapy drugs.  · Blood in the urine (hematuria) is the main sign of hemorrhagic cystitis. Hematuria can be very mild and involve microscopic bleeding that does not change the color of urine. It can also be severe and include passing urine with bright red blood or blood clots in it.  · Treatment for hemorrhagic cystitis depends on the cause and severity of the condition. In most cases, the condition will clear up with supportive care that may include rest, fluids, and antibiotics, along with removing exposure to the cause. Other treatments may be needed in more serious cases.  This information is not intended to replace advice given to you by your health care provider. Make sure you discuss any questions you have with your health care provider.  Document Released: 10/06/2017 Document Revised: 10/06/2017 Document Reviewed: 10/06/2017  Elsevier Interactive Patient Education © 2019 [employer]

## 2018-03-28 NOTE — Progress Notes (Addendum)
Subjective:     Patient ID: Katelyn Schmidt , female    DOB: 1960/04/15 , 59 y.o.   MRN: [id number]   Chief Complaint  Patient presents with  . Dysuria    patient states she has blood in her urine, urinary urgency and frequeny. patient denies having any flank or back pain at this time    HPI  Onset of seeing blood when she wipes after she voids and only 1 x d. No no dysuria, but has frequency and urgency x 1 week. No fever, chills or sweats. Has had hysterectomy.   Past Medical History:  Diagnosis Date  . Diabetes mellitus, type 2 ([place])   . Dyslipidemia   . Hypertension   . Menorrhagia    fibroids  . Obesity   . Phlebitis   . Varicose veins      Family History  Problem Relation Age of Onset  . Hypertension Mother   . Hypertension Father   . Diabetes Father   . Heart failure Father      Current Outpatient Medications:  .  amLODipine (NORVASC) 10 MG tablet, Take 10 mg by mouth daily., Disp: , Rfl:  .  aspirin 81 MG tablet, Take 81 mg by mouth daily., Disp: , Rfl:  .  Cholecalciferol (VITAMIN D-3 PO), Take 1 tablet by mouth daily., Disp: , Rfl:  .  Fexofenadine HCl (ALLEGRA PO), Take 1 tablet by mouth daily., Disp: , Rfl:  .  metFORMIN (GLUCOPHAGE) 500 MG tablet, TAKE 1 TABLET BY MOUTH TWICE DAILY, Disp: 180 tablet, Rfl: 2 .  Multiple Vitamins-Minerals (MULTIVITAMIN PO), Take 1 tablet by mouth daily., Disp: , Rfl:  .  Progesterone 50 MG SUPP, Place 1 suppository vaginally at bedtime., Disp: , Rfl:  .  rosuvastatin (CRESTOR) 10 MG tablet, Take 10 mg by mouth daily., Disp: , Rfl:  .  telmisartan-hydrochlorothiazide (MICARDIS HCT) 80-25 MG tablet, Take 1 tablet by mouth daily., Disp: , Rfl:  .  vitamin C (ASCORBIC ACID) 500 MG tablet, Take 500 mg by mouth daily., Disp: , Rfl:    No Known Allergies   Review of Systems  Constitutional: Negative for chills, diaphoresis and fever.  Genitourinary: Positive for frequency, pelvic pain, urgency, vaginal bleeding, vaginal  discharge and vaginal pain. Negative for difficulty urinating, dysuria and flank pain.  Musculoskeletal: Negative for back pain.  Skin: Negative for rash.     Today's Vitals   03/28/18 1435  BP: 102/80  Pulse: (!) 55  Temp: 98.1 F (36.7 C)  TempSrc: Oral  SpO2: 97%  Weight: 227 lb (103 kg)  Height: 5\' 3"  (1.6 m)  PainSc: 0-No pain   Body mass index is 40.21 kg/m.   Objective:  Physical Exam Vitals signs and nursing note reviewed.  Constitutional:      General: She is not in acute distress.    Appearance: Normal appearance. She is not toxic-appearing.  HENT:     Head: Normocephalic.     Right Ear: External ear normal.     Left Ear: External ear normal.     Nose: Nose normal.  Eyes:     General: No scleral icterus.    Conjunctiva/sclera: Conjunctivae normal.  Neck:     Musculoskeletal: Neck supple.  Pulmonary:     Effort: Pulmonary effort is normal.  Genitourinary:    General: Normal vulva.     Vagina: Vaginal discharge present.     Comments: Small amt of white discharge in the cuff noted, but no blood. Urethra  is pink, no signs of irritation.  Skin:    General: Skin is warm and dry.  Neurological:     Mental Status: She is alert and oriented to person, place, and time.  Psychiatric:        Mood and Affect: Mood normal.        Behavior: Behavior normal.        Thought Content: Thought content normal.        Judgment: Judgment normal.     Assessment And Plan:     1. Hematuria, unspecified type- she may have hemorrhagic cystitis.  - POCT Urinalysis Dipstick (81002)- + blood, rest neg. Apparently pt left the urine here yesterday, and we dont have it saved. Pt had already left when I found this out. I called her and she said could not void again since she emptied her bladder again.  I told her to come back if her symptoms came back, and then we need to do a UA and culture.   I placed her on Macrobid 100 mg bid x 5 days.  4:50 pm- pt came back and left a small  sample of urine. We sent a culture.  Kavion Mancinas RODRIGUEZ-SOUTHWORTH, PA-C

## 2018-03-28 NOTE — Addendum Note (Signed)
Addended by: Nicki Guadalajara on: 03/28/2018 04:54 PM   Modules accepted: Orders

## 2018-03-29 ENCOUNTER — Other Ambulatory Visit: Payer: Self-pay | Admitting: Cardiology

## 2018-03-29 LAB — URINE CULTURE

## 2018-04-18 ENCOUNTER — Ambulatory Visit: Payer: Commercial Managed Care - PPO | Admitting: Internal Medicine

## 2018-04-18 ENCOUNTER — Encounter: Payer: Self-pay | Admitting: Internal Medicine

## 2018-04-18 ENCOUNTER — Other Ambulatory Visit: Payer: Self-pay | Admitting: Internal Medicine

## 2018-04-18 VITALS — BP 116/90 | HR 68 | Temp 98.7°F | Ht 63.0 in | Wt 225.4 lb

## 2018-04-18 DIAGNOSIS — N951 Menopausal and female climacteric states: Secondary | ICD-10-CM

## 2018-04-18 DIAGNOSIS — Z6839 Body mass index (BMI) 39.0-39.9, adult: Secondary | ICD-10-CM

## 2018-04-18 DIAGNOSIS — E1165 Type 2 diabetes mellitus with hyperglycemia: Secondary | ICD-10-CM

## 2018-04-18 DIAGNOSIS — R350 Frequency of micturition: Secondary | ICD-10-CM

## 2018-04-18 DIAGNOSIS — I10 Essential (primary) hypertension: Secondary | ICD-10-CM | POA: Diagnosis not present

## 2018-04-18 LAB — POCT URINALYSIS DIPSTICK
Bilirubin, UA: NEGATIVE
Blood, UA: NEGATIVE
Glucose, UA: NEGATIVE
Ketones, UA: NEGATIVE
Nitrite, UA: NEGATIVE
Protein, UA: NEGATIVE
Spec Grav, UA: 1.015 (ref 1.010–1.025)
Urobilinogen, UA: 0.2 E.U./dL
pH, UA: 6 (ref 5.0–8.0)

## 2018-04-18 MED ORDER — TELMISARTAN-HCTZ 40-12.5 MG PO TABS: 1.0000 | ORAL_TABLET | Freq: Every day | ORAL | 1 refills | Status: DC

## 2018-04-18 NOTE — Patient Instructions (Signed)
Diabetes Mellitus and Exercise Exercising regularly is important for your overall health, especially when you have diabetes (diabetes mellitus). Exercising is not only about losing weight. It has many other health benefits, such as increasing muscle strength and bone density and reducing body fat and stress. This leads to improved fitness, flexibility, and endurance, all of which result in better overall health. Exercise has additional benefits for people with diabetes, including:  Reducing appetite.  Helping to lower and control blood glucose.  Lowering blood pressure.  Helping to control amounts of fatty substances (lipids) in the blood, such as cholesterol and triglycerides.  Helping the body to respond better to insulin (improving insulin sensitivity).  Reducing how much insulin the body needs.  Decreasing the risk for heart disease by: ? Lowering cholesterol and triglyceride levels. ? Increasing the levels of good cholesterol. ? Lowering blood glucose levels. What is my activity plan? Your health care provider or certified diabetes educator can help you make a plan for the type and frequency of exercise (activity plan) that works for you. Make sure that you:  Do at least 150 minutes of moderate-intensity or vigorous-intensity exercise each week. This could be brisk walking, biking, or water aerobics. ? Do stretching and strength exercises, such as yoga or weightlifting, at least 2 times a week. ? Spread out your activity over at least 3 days of the week.  Get some form of physical activity every day. ? Do not go more than 2 days in a row without some kind of physical activity. ? Avoid being inactive for more than 30 minutes at a time. Take frequent breaks to walk or stretch.  Choose a type of exercise or activity that you enjoy, and set realistic goals.  Start slowly, and gradually increase the intensity of your exercise over time. What do I need to know about managing my  diabetes?   Check your blood glucose before and after exercising. ? If your blood glucose is 240 mg/dL (13.3 mmol/L) or higher before you exercise, check your urine for ketones. If you have ketones in your urine, do not exercise until your blood glucose returns to normal. ? If your blood glucose is 100 mg/dL (5.6 mmol/L) or lower, eat a snack containing 15-20 grams of carbohydrate. Check your blood glucose 15 minutes after the snack to make sure that your level is above 100 mg/dL (5.6 mmol/L) before you start your exercise.  Know the symptoms of low blood glucose (hypoglycemia) and how to treat it. Your risk for hypoglycemia increases during and after exercise. Common symptoms of hypoglycemia can include: ? Hunger. ? Anxiety. ? Sweating and feeling clammy. ? Confusion. ? Dizziness or feeling light-headed. ? Increased heart rate or palpitations. ? Blurry vision. ? Tingling or numbness around the mouth, lips, or tongue. ? Tremors or shakes. ? Irritability.  Keep a rapid-acting carbohydrate snack available before, during, and after exercise to help prevent or treat hypoglycemia.  Avoid injecting insulin into areas of the body that are going to be exercised. For example, avoid injecting insulin into: ? The arms, when playing tennis. ? The legs, when jogging.  Keep records of your exercise habits. Doing this can help you and your health care provider adjust your diabetes management plan as needed. Write down: ? Food that you eat before and after you exercise. ? Blood glucose levels before and after you exercise. ? The type and amount of exercise you have done. ? When your insulin is expected to peak, if you use   insulin. Avoid exercising at times when your insulin is peaking.  When you start a new exercise or activity, work with your health care provider to make sure the activity is safe for you, and to adjust your insulin, medicines, or food intake as needed.  Drink plenty of water while  you exercise to prevent dehydration or heat stroke. Drink enough fluid to keep your urine clear or pale yellow. Summary  Exercising regularly is important for your overall health, especially when you have diabetes (diabetes mellitus).  Exercising has many health benefits, such as increasing muscle strength and bone density and reducing body fat and stress.  Your health care provider or certified diabetes educator can help you make a plan for the type and frequency of exercise (activity plan) that works for you.  When you start a new exercise or activity, work with your health care provider to make sure the activity is safe for you, and to adjust your insulin, medicines, or food intake as needed. This information is not intended to replace advice given to you by your health care provider. Make sure you discuss any questions you have with your health care provider. Document Released: 05/01/2003 Document Revised: 08/19/2016 Document Reviewed: 07/21/2015 Elsevier Interactive Patient Education  2019 [employer]  

## 2018-04-18 NOTE — Progress Notes (Signed)
Subjective:     Patient ID: Katelyn Schmidt , female    DOB: 10-Aug-1960 , 58 y.o.   MRN: [id number]   Chief Complaint  Patient presents with  . hormones f/u  . Diabetes  . Hypertension    HPI  She is here today for f/u BHRT. She feels well on her current regimen. She is currently taking Progesterone SR capsules nightly except Sundays.   Diabetes  She presents for her follow-up diabetic visit. She has type 2 diabetes mellitus. Her disease course has been stable. There are no hypoglycemic associated symptoms. Pertinent negatives for diabetes include no blurred vision and no chest pain. There are no hypoglycemic complications. Risk factors for coronary artery disease include diabetes mellitus, hypertension, obesity, sedentary lifestyle and post-menopausal. An ACE inhibitor/angiotensin II receptor blocker is being taken. Eye exam is current.  Hypertension  This is a chronic problem. The current episode started 1 to 4 weeks ago. The problem has been gradually improving since onset. The problem is controlled. Pertinent negatives include no blurred vision, chest pain, palpitations or shortness of breath.   She reports compliance with meds.   Past Medical History:  Diagnosis Date  . Diabetes mellitus, type 2 ([place])   . Dyslipidemia   . Hypertension   . Menorrhagia    fibroids  . Obesity   . Phlebitis   . Varicose veins      Family History  Problem Relation Age of Onset  . Hypertension Mother   . Hypertension Father   . Diabetes Father   . Heart failure Father      Current Outpatient Medications:  .  amLODipine (NORVASC) 10 MG tablet, Take 10 mg by mouth daily., Disp: , Rfl:  .  aspirin 81 MG tablet, Take 81 mg by mouth daily., Disp: , Rfl:  .  Cholecalciferol (VITAMIN D-3 PO), Take 1 tablet by mouth daily., Disp: , Rfl:  .  Fexofenadine HCl (ALLEGRA PO), Take 1 tablet by mouth daily., Disp: , Rfl:  .  metFORMIN (GLUCOPHAGE) 500 MG tablet, TAKE 1 TABLET BY MOUTH TWICE DAILY,  Disp: 180 tablet, Rfl: 2 .  Multiple Vitamins-Minerals (MULTIVITAMIN PO), Take 1 tablet by mouth daily., Disp: , Rfl:  .  Progesterone 50 MG SUPP, Place 1 suppository vaginally at bedtime., Disp: , Rfl:  .  rosuvastatin (CRESTOR) 10 MG tablet, TAKE 1 TABLET BY MOUTH DAILY, Disp: 90 tablet, Rfl: 0 .  telmisartan-hydrochlorothiazide (MICARDIS HCT) 80-25 MG tablet, Take 1 tablet by mouth daily. 1/2 tab daily, Disp: , Rfl:  .  vitamin C (ASCORBIC ACID) 500 MG tablet, Take 500 mg by mouth daily., Disp: , Rfl:    No Known Allergies   Review of Systems  Constitutional: Negative.   Eyes: Negative for blurred vision.  Respiratory: Negative.  Negative for shortness of breath.   Cardiovascular: Negative.  Negative for chest pain and palpitations.  Gastrointestinal: Negative.   Genitourinary: Positive for frequency.  Neurological: Negative.   Psychiatric/Behavioral: Negative.      Today's Vitals   04/18/18 0843  BP: 116/90  Pulse: 68  Temp: 98.7 F (37.1 C)  TempSrc: Oral  Weight: 225 lb 6.4 oz (102.2 kg)  Height: '5\' 3"'$  (1.6 m)   Body mass index is 39.93 kg/m.   Objective:  Physical Exam Vitals signs and nursing note reviewed.  Constitutional:      Appearance: Normal appearance. She is obese.  HENT:     Head: Normocephalic and atraumatic.  Cardiovascular:     Rate  and Rhythm: Normal rate and regular rhythm.     Heart sounds: Normal heart sounds.  Pulmonary:     Effort: Pulmonary effort is normal.     Breath sounds: Normal breath sounds.  Skin:    General: Skin is warm.  Neurological:     General: No focal deficit present.     Mental Status: She is alert.  Psychiatric:        Mood and Affect: Mood normal.        Behavior: Behavior normal.         Assessment And Plan:     1. Female climacteric state  She will continue with her current regimen. She will rto in four months for re-evaluation.   2. Uncontrolled type 2 diabetes mellitus with hyperglycemia ([place])  I will  check labs as listed below. She is encouraged to resume her regular exercise regimen.   - BMP8+EGFR - Hemoglobin A1c  3. Essential hypertension  Fair control. She will continue with current meds. She is encouraged to avoid adding salt to her foods.   4. Urinary frequency  I will check urinalysis today. She was treated for UTI 3 weeks ago.   5. Class 2 severe obesity due to excess calories with serious comorbidity and body mass index (BMI) of 39.0 to 39.9 in adult [facility])  Importance of achieving optimal weight to decrease risk of cardiovascular disease and cancers was discussed with the patient in full detail. She is encouraged to start slowly - start with 10 minutes twice daily at least three to four days per week and to gradually build to 30 minutes five days weekly. She was given tips to incorporate more activity into her daily routine - take stairs when possible, park farther away from her job, grocery stores, etc.    Maximino Greenland, MD

## 2018-04-19 LAB — BMP8+EGFR
BUN/Creatinine Ratio: 17 (ref 9–23)
BUN: 16 mg/dL (ref 6–24)
CO2: 25 mmol/L (ref 20–29)
Calcium: 10.7 mg/dL — ABNORMAL HIGH (ref 8.7–10.2)
Chloride: 104 mmol/L (ref 96–106)
Creatinine, Ser: 0.93 mg/dL (ref 0.57–1.00)
GFR calc Af Amer: 80 mL/min/{1.73_m2} (ref >59)
GFR calc non Af Amer: 69 mL/min/{1.73_m2} (ref >59)
Glucose: 110 mg/dL — ABNORMAL HIGH (ref 65–99)
Potassium: 4.3 mmol/L (ref 3.5–5.2)
Sodium: 144 mmol/L (ref 134–144)

## 2018-04-19 LAB — HEMOGLOBIN A1C
Est. average glucose Bld gHb Est-mCnc: 154 mg/dL
Hgb A1c MFr Bld: 7 % — ABNORMAL HIGH (ref 4.8–5.6)

## 2018-04-19 LAB — URINE CULTURE

## 2018-05-05 ENCOUNTER — Encounter: Payer: Self-pay | Admitting: Internal Medicine

## 2018-05-07 ENCOUNTER — Other Ambulatory Visit: Payer: Self-pay | Admitting: Cardiology

## 2018-06-02 ENCOUNTER — Other Ambulatory Visit: Payer: Self-pay | Admitting: Internal Medicine

## 2018-06-06 ENCOUNTER — Other Ambulatory Visit: Payer: Self-pay | Admitting: Cardiology

## 2018-07-15 ENCOUNTER — Other Ambulatory Visit: Payer: Self-pay | Admitting: Internal Medicine

## 2018-08-17 ENCOUNTER — Ambulatory Visit: Payer: Commercial Managed Care - PPO | Admitting: Internal Medicine

## 2018-09-28 ENCOUNTER — Encounter: Payer: Self-pay | Admitting: Internal Medicine

## 2018-09-28 ENCOUNTER — Ambulatory Visit: Payer: Commercial Managed Care - PPO | Admitting: Internal Medicine

## 2018-09-28 ENCOUNTER — Other Ambulatory Visit: Payer: Self-pay

## 2018-09-28 VITALS — BP 110/72 | HR 58 | Temp 98.0°F | Ht 63.0 in | Wt 211.4 lb

## 2018-09-28 DIAGNOSIS — I10 Essential (primary) hypertension: Secondary | ICD-10-CM

## 2018-09-28 DIAGNOSIS — E1165 Type 2 diabetes mellitus with hyperglycemia: Secondary | ICD-10-CM

## 2018-09-28 DIAGNOSIS — N951 Menopausal and female climacteric states: Secondary | ICD-10-CM

## 2018-09-28 DIAGNOSIS — Z6837 Body mass index (BMI) 37.0-37.9, adult: Secondary | ICD-10-CM

## 2018-09-28 NOTE — Patient Instructions (Signed)
Type 2 Diabetes Mellitus, Self Care, Adult When you have type 2 diabetes (type 2 diabetes mellitus), you must make sure your blood sugar (glucose) stays in a healthy range. You can do this with:  Nutrition.  Exercise.  Lifestyle changes.  Medicines or insulin, if needed.  Support from your doctors and others. How to stay aware of blood sugar   Check your blood sugar level every day, as often as told.  Have your A1c (hemoglobin A1c) level checked two or more times a year. Have it checked more often if your doctor tells you to. Your doctor will set personal treatment goals for you. Generally, you should have these blood sugar levels:  Before meals (preprandial): 80-130 mg/dL (4.4-7.2 mmol/L).  After meals (postprandial): below 180 mg/dL (10 mmol/L).  A1c level: less than 7%. How to manage high and low blood sugar Signs of high blood sugar High blood sugar is called hyperglycemia. Know the signs of high blood sugar. Signs may include:  Feeling: ? Thirsty. ? Hungry. ? Very tired.  Needing to pee (urinate) more than usual.  Blurry vision. Signs of low blood sugar Low blood sugar is called hypoglycemia. This is when blood sugar is at or below 70 mg/dL (3.9 mmol/L). Signs may include:  Feeling: ? Hungry. ? Worried or nervous (anxious). ? Sweaty and clammy. ? Confused. ? Dizzy. ? Sleepy. ? Sick to your stomach (nauseous).  Having: ? A fast heartbeat. ? A headache. ? A change in your vision. ? Jerky movements that you cannot control (seizure). ? Tingling or no feeling (numbness) around your mouth, lips, or tongue.  Having trouble with: ? Moving (coordination). ? Sleeping. ? Passing out (fainting). ? Getting upset easily (irritability). Treating low blood sugar To treat low blood sugar, eat or drink something sugary right away. If you can think clearly and swallow safely, follow the 15:15 rule:  Take 15 grams of a fast-acting carb (carbohydrate). Talk with your  doctor about how much you should take.  Some fast-acting carbs are: ? Sugar tablets (glucose pills). Take 3-4 pills. ? 6-8 pieces of hard candy. ? 4-6 oz (120-150 mL) of fruit juice. ? 4-6 oz (120-150 mL) of regular (not diet) soda. ? 1 Tbsp (15 mL) honey or sugar.  Check your blood sugar 15 minutes after you take the carb.  If your blood sugar is still at or below 70 mg/dL (3.9 mmol/L), take 15 grams of a carb again.  If your blood sugar does not go above 70 mg/dL (3.9 mmol/L) after 3 tries, get help right away.  After your blood sugar goes back to normal, eat a meal or a snack within 1 hour. Treating very low blood sugar If your blood sugar is at or below 54 mg/dL (3 mmol/L), you have very low blood sugar (severe hypoglycemia). This is an emergency. Do not wait to see if the symptoms will go away. Get medical help right away. Call your local emergency services (911 in the [place] If you have very low blood sugar and you cannot eat or drink, you may need a glucagon shot (injection). A family member or friend should learn how to check your blood sugar and how to give you a glucagon shot. Ask your doctor if you need to have a glucagon shot kit at home. Follow these instructions at home: Medicine  Take insulin and diabetes medicines as told.  If your doctor says you should take more or less insulin and medicines, do this exactly as told.  Do not run out of insulin or medicines. Having diabetes can raise your risk for other long-term conditions. These include heart disease and kidney disease. Your doctor may prescribe medicines to help you not have these problems. Food   Make healthy food choices. These include: ? Chicken, fish, egg whites, and beans. ? Oats, whole wheat, bulgur, brown rice, quinoa, and millet. ? Fresh fruits and vegetables. ? Low-fat dairy products. ? Nuts, avocado, olive oil, and canola oil.  Meet with a food specialist (dietitian). He or she can help you make an  eating plan that is right for you.  Follow instructions from your doctor about what you cannot eat or drink.  Drink enough fluid to keep your pee (urine) pale yellow.  Keep track of carbs that you eat. Do this by reading food labels and learning food serving sizes.  Follow your sick day plan when you cannot eat or drink normally. Make this plan with your doctor so it is ready to use. Activity  Exercise 3 or more times a week.  Do not go more than 2 days without exercising.  Talk with your doctor before you start a new exercise. Your doctor may need to tell you to change: ? How much insulin or medicines you take. ? How much food you eat. Lifestyle  Do not use any tobacco products. These include cigarettes, chewing tobacco, and e-cigarettes. If you need help quitting, ask your doctor.  Ask your doctor how much alcohol is safe for you.  Learn to deal with stress. If you need help with this, ask your doctor. Body care   Stay up to date with your shots (immunizations).  Have your eyes and feet checked by a doctor as often as told.  Check your skin and feet every day. Check for cuts, bruises, redness, blisters, or sores.  Brush your teeth and gums two times a day. Floss one or more times a day.  Go to the dentist one or more times every 6 months.  Stay at a healthy weight. General instructions  Take over-the-counter and prescription medicines only as told by your doctor.  Share your diabetes care plan with: ? Your work or school. ? People you live with.  Carry a card or wear jewelry that says you have diabetes.  Keep all follow-up visits as told by your doctor. This is important. Questions to ask your doctor  Do I need to meet with a diabetes educator?  Where can I find a support group for people with diabetes? Where to find more information To learn more about diabetes, visit:  American Diabetes Association: www.diabetes.org  American Association of Diabetes  Educators: www.diabeteseducator.org Summary  When you have type 2 diabetes, you must make sure your blood sugar (glucose) stays in a healthy range.  Check your blood sugar every day, as often as told.  Having diabetes can raise your risk for other conditions. Your doctor may prescribe medicines to help you not have these problems.  Keep all follow-up visits as told by your doctor. This is important. This information is not intended to replace advice given to you by your health care provider. Make sure you discuss any questions you have with your health care provider. Document Released: 06/02/2015 Document Revised: 08/01/2017 Document Reviewed: 03/14/2015 Elsevier Patient Education  2020 [employer]vier Inc.  

## 2018-09-29 LAB — CMP14+EGFR
ALT: 18 IU/L (ref 0–32)
AST: 24 IU/L (ref 0–40)
Albumin/Globulin Ratio: 1.6 (ref 1.2–2.2)
Albumin: 4.2 g/dL (ref 3.8–4.9)
Alkaline Phosphatase: 81 IU/L (ref 39–117)
BUN/Creatinine Ratio: 15 (ref 9–23)
BUN: 15 mg/dL (ref 6–24)
Bilirubin Total: 0.4 mg/dL (ref 0.0–1.2)
CO2: 22 mmol/L (ref 20–29)
Calcium: 10.5 mg/dL — ABNORMAL HIGH (ref 8.7–10.2)
Chloride: 101 mmol/L (ref 96–106)
Creatinine, Ser: 1.03 mg/dL — ABNORMAL HIGH (ref 0.57–1.00)
GFR calc Af Amer: 70 mL/min/{1.73_m2} (ref >59)
GFR calc non Af Amer: 61 mL/min/{1.73_m2} (ref >59)
Globulin, Total: 2.6 g/dL (ref 1.5–4.5)
Glucose: 93 mg/dL (ref 65–99)
Potassium: 4.2 mmol/L (ref 3.5–5.2)
Sodium: 140 mmol/L (ref 134–144)
Total Protein: 6.8 g/dL (ref 6.0–8.5)

## 2018-09-29 LAB — LIPID PANEL
Chol/HDL Ratio: 3 ratio (ref 0.0–4.4)
Cholesterol, Total: 125 mg/dL (ref 100–199)
HDL: 41 mg/dL (ref >39)
LDL Calculated: 65 mg/dL (ref 0–99)
Triglycerides: 96 mg/dL (ref 0–149)
VLDL Cholesterol Cal: 19 mg/dL (ref 5–40)

## 2018-09-29 LAB — HEMOGLOBIN A1C
Est. average glucose Bld gHb Est-mCnc: 137 mg/dL
Hgb A1c MFr Bld: 6.4 % — ABNORMAL HIGH (ref 4.8–5.6)

## 2018-10-02 NOTE — Progress Notes (Signed)
Subjective:     Patient ID: Katelyn Schmidt , female    DOB: [birth year] , 58 y.o.   MRN: [id number]   Chief Complaint  Patient presents with  . Diabetes  . Hypertension    HPI  Diabetes She presents for her follow-up diabetic visit. She has type 2 diabetes mellitus. There are no hypoglycemic associated symptoms. There are no diabetic associated symptoms. Weakness:  There are no hypoglycemic complications. Symptoms are stable. Risk factors for coronary artery disease include diabetes mellitus, dyslipidemia, hypertension, post-menopausal and sedentary lifestyle. She is compliant with treatment most of the time.  Hypertension This is a chronic problem. The current episode started more than 1 year ago. The problem has been gradually improving since onset. The problem is controlled.     Past Medical History:  Diagnosis Date  . Diabetes mellitus, type 2 ([place])   . Dyslipidemia   . Hypertension   . Menorrhagia    fibroids  . Obesity   . Phlebitis   . Varicose veins      Family History  Problem Relation Age of Onset  . Hypertension Mother   . Hypertension Father   . Diabetes Father   . Heart failure Father      Current Outpatient Medications:  .  amLODipine (NORVASC) 10 MG tablet, TAKE 1 TABLET BY MOUTH EVERY DAY, Disp: 90 tablet, Rfl: 1 .  aspirin 81 MG tablet, Take 81 mg by mouth daily., Disp: , Rfl:  .  Fexofenadine HCl (ALLEGRA PO), Take 1 tablet by mouth daily., Disp: , Rfl:  .  metFORMIN (GLUCOPHAGE) 500 MG tablet, TAKE 1 TABLET BY MOUTH TWICE DAILY, Disp: 180 tablet, Rfl: 2 .  Multiple Vitamins-Minerals (MULTIVITAMIN PO), Take 1 tablet by mouth daily., Disp: , Rfl:  .  ONE TOUCH ULTRA TEST test strip, TEST BLOOD SUGAR TWICE DAILY, Disp: 100 each, Rfl: 1 .  Progesterone 50 MG SUPP, Place 1 suppository vaginally at bedtime., Disp: , Rfl:  .  rosuvastatin (CRESTOR) 10 MG tablet, TAKE 1 TABLET BY MOUTH DAILY, Disp: 90 tablet, Rfl: 0 .  telmisartan-hydrochlorothiazide  (MICARDIS HCT) 40-12.5 MG tablet, Take 1 tablet by mouth daily., Disp: 90 tablet, Rfl: 1 .  vitamin C (ASCORBIC ACID) 500 MG tablet, Take 1,000 mg by mouth daily. , Disp: , Rfl:    No Known Allergies   Review of Systems  Constitutional: Negative.   Respiratory: Negative.   Cardiovascular: Negative.   Gastrointestinal: Negative.   Neurological: Negative.  Weakness:   Psychiatric/Behavioral: Negative.      Today's Vitals   09/28/18 0937  BP: 110/72  Pulse: (!) 58  Temp: 98 F (36.7 C)  TempSrc: Oral  Weight: 211 lb 6.4 oz (95.9 kg)  Height: '5\' 3"'$  (1.6 m)   Body mass index is 37.45 kg/m.   Objective:  Physical Exam Vitals signs and nursing note reviewed.  Constitutional:      Appearance: Normal appearance.  HENT:     Head: Normocephalic and atraumatic.  Cardiovascular:     Rate and Rhythm: Normal rate and regular rhythm.     Heart sounds: Normal heart sounds.  Pulmonary:     Effort: Pulmonary effort is normal.     Breath sounds: Normal breath sounds.  Skin:    General: Skin is warm.  Neurological:     General: No focal deficit present.     Mental Status: She is alert.  Psychiatric:        Mood and Affect: Mood normal.  Behavior: Behavior normal.         Assessment And Plan:     1. Uncontrolled type 2 diabetes mellitus with hyperglycemia ([facility])  Chronic, I will check labs as listed below. She is encouraged to incorporate more exercise into her daily routine. - Lipid panel - CMP14+EGFR - Hemoglobin A1c  2. Essential hypertension  Chronic, well controlled. She will continue with current meds.   3. 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 She will continue with progesterone supplementation.   4. Class 2 severe obesity due to excess calories with serious comorbidity and body mass index (BMI) of 37.0 to 37.9 in adult [facility])  She was congratulated on her 14 pound weight loss and encouraged to keep up the great work. She is encouraged to strive for another 14  pound weight loss prior to her next visit.   Maximino Greenland, MD    THE PATIENT IS ENCOURAGED TO PRACTICE SOCIAL DISTANCING DUE TO THE COVID-19 PANDEMIC.

## 2018-11-01 ENCOUNTER — Other Ambulatory Visit: Payer: Self-pay

## 2018-11-01 MED ORDER — TELMISARTAN-HCTZ 40-12.5 MG PO TABS: 1.0000 | ORAL_TABLET | Freq: Every day | ORAL | 1 refills | Status: DC

## 2018-12-05 ENCOUNTER — Telehealth: Payer: Self-pay

## 2018-12-05 ENCOUNTER — Other Ambulatory Visit: Payer: Self-pay | Admitting: Internal Medicine

## 2018-12-05 DIAGNOSIS — Z1231 Encounter for screening mammogram for malignant neoplasm of breast: Secondary | ICD-10-CM

## 2018-12-05 NOTE — Telephone Encounter (Signed)
The pt left a message that she saw that metformin was on a recall list.  I returned the pt's call and left a message for her to check with her pharmacy to see if her refill is on the recall list.

## 2018-12-20 ENCOUNTER — Encounter: Payer: Commercial Managed Care - PPO | Admitting: Internal Medicine

## 2018-12-25 ENCOUNTER — Other Ambulatory Visit: Payer: Self-pay

## 2018-12-25 ENCOUNTER — Ambulatory Visit (INDEPENDENT_AMBULATORY_CARE_PROVIDER_SITE_OTHER): Payer: Commercial Managed Care - PPO | Admitting: Cardiology

## 2018-12-25 ENCOUNTER — Encounter: Payer: Self-pay | Admitting: Cardiology

## 2018-12-25 VITALS — BP 138/77 | HR 53 | Ht 64.0 in | Wt 205.5 lb

## 2018-12-25 DIAGNOSIS — I10 Essential (primary) hypertension: Secondary | ICD-10-CM | POA: Diagnosis not present

## 2018-12-25 DIAGNOSIS — R001 Bradycardia, unspecified: Secondary | ICD-10-CM | POA: Diagnosis not present

## 2018-12-25 DIAGNOSIS — E7800 Pure hypercholesterolemia, unspecified: Secondary | ICD-10-CM | POA: Diagnosis not present

## 2018-12-25 DIAGNOSIS — Z6835 Body mass index (BMI) 35.0-35.9, adult: Secondary | ICD-10-CM

## 2018-12-25 NOTE — Progress Notes (Signed)
Primary Physician/Referring:  Glendale Chard, MD  Patient ID: Katelyn Schmidt, female    DOB: 09/09/1960, 58 y.o.   MRN: [id number]  Chief Complaint  Patient presents with  . Hypertension  . Hyperlipidemia  . Sinus Bradycardia  . Follow-up    104yr   HPI:    Katelyn Schmidt  is a 58 y.o. African American female with hypertension, hyperlipidemia, DM and chronic bradycardia.  Her heart rate has been mostly in the mid to upper 40s. Patient is basically asymptomatic. She denies any dizziness, near-syncope or syncope. Denies any palpitations.  No complaints of chest pain, tightness or pressure. No shortness of breath, orthopnea or PND. No history of swelling on the legs and no claudication. She has varicose veins in the left leg.  Past Medical History:  Diagnosis Date  . Diabetes mellitus, type 2 ([place])   . Dyslipidemia   . Hypertension   . Menorrhagia    fibroids  . Obesity   . Phlebitis   . Varicose veins    Past Surgical History:  Procedure Laterality Date  . CESAREAN SECTION    . TOTAL ABDOMINAL HYSTERECTOMY     ovaries remain   Social History   Socioeconomic History  . Marital status: Divorced    Spouse name: Not on file  . Number of children: 2  . Years of education: Not on file  . Highest education level: Not on file  Occupational History  . Not on file  Social Needs  . Financial resource strain: Not on file  . Food insecurity    Worry: Not on file    Inability: Not on file  . Transportation needs    Medical: Not on file    Non-medical: Not on file  Tobacco Use  . Smoking status: Never Smoker  . Smokeless tobacco: Never Used  Substance and Sexual Activity  . Alcohol use: Yes    Comment: occas 1x mo  . Drug use: No  . Sexual activity: Not on file  Lifestyle  . Physical activity    Days per week: Not on file    Minutes per session: Not on file  . Stress: Not on file  Relationships  . Social Herbalist on phone: Not on file    Gets  together: Not on file    Attends religious service: Not on file    Active member of club or organization: Not on file    Attends meetings of clubs or organizations: Not on file    Relationship status: Not on file  . Intimate partner violence    Fear of current or ex partner: Not on file    Emotionally abused: Not on file    Physically abused: Not on file    Forced sexual activity: Not on file  Other Topics Concern  . Not on file  Social History Narrative  . Not on file   ROS  Review of Systems  Constitution: Negative for chills, decreased appetite, malaise/fatigue and weight gain.  Cardiovascular: Negative for dyspnea on exertion, leg swelling and syncope.  Endocrine: Negative for cold intolerance.  Hematologic/Lymphatic: Does not bruise/bleed easily.  Musculoskeletal: Negative for joint swelling.  Gastrointestinal: Negative for abdominal pain, anorexia, change in bowel habit, hematochezia and melena.  Neurological: Negative for headaches and light-headedness.  Psychiatric/Behavioral: Negative for depression and substance abuse.  All other systems reviewed and are negative.  Objective   Vitals with BMI 12/25/2018 09/28/2018 04/18/2018  Height 5\' 4"  5\' 3"   5\' 3"   Weight 205 lbs 8 oz 211 lbs 6 oz 225 lbs 6 oz  BMI 35.26 99991111 [id number]  Systolic 0000000 [id number]  Diastolic 77 72 90  Pulse 53 58 68    Blood pressure 138/77, pulse (!) 53, height 5\' 4"  (1.626 m), weight 205 lb 8 oz (93.2 kg), SpO2 97 %. Body mass index is 35.27 kg/m.   Physical Exam  Constitutional:  She is moderately built and moderately obese in no acute distress.  HENT:  Head: Atraumatic.  Eyes: Conjunctivae are normal.  Neck: Neck supple. No JVD present. No thyromegaly present.  Cardiovascular: Normal rate, regular rhythm, normal heart sounds and intact distal pulses. Exam reveals no gallop.  No murmur heard. No leg edema, no JVD.  Pulmonary/Chest: Effort normal and breath sounds normal.  Abdominal: Soft. Bowel  sounds are normal.  Musculoskeletal: Normal range of motion.  Neurological: She is alert.  Skin: Skin is warm and dry.  Psychiatric: She has a normal mood and affect.    Laboratory examination:   Recent Labs    04/18/18 0942 09/28/18 1017  NA 144 140  K 4.3 4.2  CL 104 101  CO2 25 22  GLUCOSE 110* 93  BUN 16 15  CREATININE 0.93 1.03*  CALCIUM 10.7* 10.5*  GFRNONAA 69 61  GFRAA 80 70   CMP Latest Ref Rng & Units 09/28/2018 04/18/2018 12/15/2017  Glucose 65 - 99 mg/dL 93 110(H) 105(H)  BUN 6 - 24 mg/dL 15 16 11   Creatinine 0.57 - 1.00 mg/dL 1.03(H) 0.93 0.85  Sodium 134 - 144 mmol/L 140 144 142  Potassium 3.5 - 5.2 mmol/L 4.2 4.3 4.1  Chloride 96 - 106 mmol/L 101 104 104  CO2 20 - 29 mmol/L 22 25 24   Calcium 8.7 - 10.2 mg/dL 10.5(H) 10.7(H) 10.6(H)  Total Protein 6.0 - 8.5 g/dL 6.8 - 6.9  Total Bilirubin 0.0 - 1.2 mg/dL 0.4 - 0.4  Alkaline Phos 39 - 117 IU/L 81 - 96  AST 0 - 40 IU/L 24 - 15  ALT 0 - 32 IU/L 18 - 18   CBC Latest Ref Rng & Units 12/15/2017  WBC 3.4 - 10.8 x10E3/uL 6.6  Hemoglobin 11.1 - 15.9 g/dL 13.8  Hematocrit 34.0 - 46.6 % 44.3  Platelets 150 - 450 x10E3/uL 319   Lipid Panel     Component Value Date/Time   CHOL 125 09/28/2018 1017   TRIG 96 09/28/2018 1017   HDL 41 09/28/2018 1017   CHOLHDL 3.0 09/28/2018 1017   LDLCALC 65 09/28/2018 1017   HEMOGLOBIN A1C Lab Results  Component Value Date   HGBA1C 6.4 (H) 09/28/2018   TSH No results for input(s): TSH in the last 8760 hours. Medications and allergies  No Known Allergies   Prior to Admission medications   Medication Sig Start Date End Date Taking? Authorizing Provider  amLODipine (NORVASC) 10 MG tablet TAKE 1 TABLET BY MOUTH EVERY DAY 07/18/18   Minette Brine, FNP  aspirin 81 MG tablet Take 81 mg by mouth daily.    [provider]  Fexofenadine HCl (ALLEGRA PO) Take 1 tablet by mouth daily.    [provider]  metFORMIN (GLUCOPHAGE) 500 MG tablet TAKE 1 TABLET BY MOUTH  TWICE DAILY 02/28/18   Glendale Chard, MD  Multiple Vitamins-Minerals (MULTIVITAMIN PO) Take 1 tablet by mouth daily.    [provider]  ONE TOUCH ULTRA TEST test strip TEST BLOOD SUGAR TWICE DAILY 06/05/18   Glendale Chard, MD  Progesterone 50 MG SUPP Place 1 suppository vaginally at bedtime.    [provider]  rosuvastatin (CRESTOR) 10 MG tablet TAKE 1 TABLET BY MOUTH DAILY 06/06/18   Despina Hick, MD  telmisartan-hydrochlorothiazide (MICARDIS HCT) 40-12.5 MG tablet Take 1 tablet by mouth daily. 11/01/18   Glendale Chard, MD  vitamin C (ASCORBIC ACID) 500 MG tablet Take 1,000 mg by mouth daily.     [provider]     Current Outpatient Medications  Medication Instructions  . amLODipine (NORVASC) 10 MG tablet TAKE 1 TABLET BY MOUTH EVERY DAY  . Ascorbic Acid (VITAMIN C) 1000 MG tablet Oral, Daily  . aspirin 81 mg, Oral, Daily  . Fexofenadine HCl (ALLEGRA PO) 1 tablet, Oral, Daily  . loratadine (CLARITIN) 10 mg, Oral, Daily  . metFORMIN (GLUCOPHAGE) 500 MG tablet TAKE 1 TABLET BY MOUTH TWICE DAILY  . Multiple Vitamins-Minerals (MULTIVITAMIN PO) 1 tablet, Oral, Daily  . ONE TOUCH ULTRA TEST test strip TEST BLOOD SUGAR TWICE DAILY  . progesterone (PROMETRIUM) 50 mg, Oral, Daily at bedtime, Except Sundays   . rosuvastatin (CRESTOR) 10 MG tablet TAKE 1 TABLET BY MOUTH DAILY  . telmisartan-hydrochlorothiazide (MICARDIS HCT) 40-12.5 MG tablet 1 tablet, Oral, Daily    Radiology:  No results found.  Cardiac Studies:   Treadmill stress test [11/07/2015]: Indications: HLD, HTN, Diabetes The resting electrocardiogram demonstrated normal sinus rhythm, normal resting conduction, no resting arrhythmias and normal rest repolarization. The stress electrocardiogram was normal. There were no significant arrhythmias. Patient exercised on Bruce protocol for 6:00 minutes and achieved 95% of Max Predicted HR (Target HR was >85% MPHR) and 7.06 METS. Stress symptoms included fatigue  and dyspnea. Normal BP response. Exercise capacity was normal . Impression: Normal stress EKG. No significant arrhythmias. Normal BP response. Reduced aerobic tolerence.  Echocardiogram [10/30/2015]: Left ventricle cavity is normal in size. Moderate concentric hypertrophy of the left ventricle. Normal global wall motion. Normal diastolic filling pattern. Calculated EF 55%. Mild (Grade I) aortic regurgitation. Mild (Grade I) mitral regurgitation. Mild tricuspid regurgitation. No evidence of pulmonary hypertension. IVC is normal in size but with poor inspiration collapse consistent with elevated right atrial pressure.  Assessment     ICD-10-CM   1. Sinus bradycardia by electrocardiogram  R00.1 EKG 12-Lead  2. Hypercholesteremia  E78.00   3. Essential hypertension  I10 EKG 12-Lead  4. Class 2 severe obesity due to excess calories with serious comorbidity and body mass index (BMI) of 35.0 to 35.9 in adult (HCC)  E66.01    Z68.35   5. Hypercalcemia  E83.52     EKG 12/25/2018: Marked sinus bradycardia at the rate of 51 bpm, normal axis, otherwise normal EKG. No significant change from  02/28/2017.  Recommendations:   Patient is being seen at one year follow-up for asymptomatic bradycardia, she continues to remain asymptomatic with regard to bradycardia, had normal heart rate response to treadmill stress testing 2017,  blood pressure is well maintained, I also reviewed her labs, lipids aren't also excellent control.  She has lost about 15-20 pounds in weight since last year, BMI has decreased from 38 and close to 39 , now 35.  I have congratulated her.  As she has had a complete workup from cardiac standpoint with regard to bradycardia, she is asymptomatic bradycardia and no further evaluation is indicated.  On review of her labs, she seems to have persistent hypercalcemia.  She may need a workup for the same.  Otherwise from cardiac standpoint no other specific recommendations,  advised her to  continue present medications, we will see her back on a p.r.n. basis.  Adrian Prows, MD, [facility] 12/25/2018, 9:41 AM [facility] [place] Pager: [phone] Office: [phone] If no answer Cell [phone]

## 2019-01-10 LAB — HM DIABETES EYE EXAM

## 2019-01-16 ENCOUNTER — Encounter: Payer: Self-pay | Admitting: Internal Medicine

## 2019-01-23 ENCOUNTER — Other Ambulatory Visit: Payer: Self-pay

## 2019-01-23 ENCOUNTER — Ambulatory Visit
Admission: RE | Admit: 2019-01-23 | Discharge: 2019-01-23 | Disposition: A | Discharge status: Home / Self Care | Payer: Commercial Managed Care - PPO | Source: Ambulatory Visit | Attending: Internal Medicine | Admitting: Internal Medicine

## 2019-01-23 DIAGNOSIS — Z1231 Encounter for screening mammogram for malignant neoplasm of breast: Secondary | ICD-10-CM

## 2019-01-29 ENCOUNTER — Ambulatory Visit (INDEPENDENT_AMBULATORY_CARE_PROVIDER_SITE_OTHER): Payer: Commercial Managed Care - PPO | Admitting: Internal Medicine

## 2019-01-29 ENCOUNTER — Encounter: Payer: Self-pay | Admitting: Internal Medicine

## 2019-01-29 ENCOUNTER — Other Ambulatory Visit: Payer: Self-pay

## 2019-01-29 VITALS — BP 126/74 | HR 57 | Temp 98.2°F | Ht 64.0 in | Wt 203.2 lb

## 2019-01-29 DIAGNOSIS — Z0000 Encounter for general adult medical examination without abnormal findings: Secondary | ICD-10-CM

## 2019-01-29 DIAGNOSIS — E1165 Type 2 diabetes mellitus with hyperglycemia: Secondary | ICD-10-CM

## 2019-01-29 DIAGNOSIS — E6609 Other obesity due to excess calories: Secondary | ICD-10-CM | POA: Diagnosis not present

## 2019-01-29 DIAGNOSIS — I10 Essential (primary) hypertension: Secondary | ICD-10-CM

## 2019-01-29 DIAGNOSIS — E66811 Obesity, class 1: Secondary | ICD-10-CM

## 2019-01-29 DIAGNOSIS — Z6834 Body mass index (BMI) 34.0-34.9, adult: Secondary | ICD-10-CM

## 2019-01-29 LAB — POCT URINALYSIS DIPSTICK
Bilirubin, UA: NEGATIVE
Glucose, UA: NEGATIVE
Ketones, UA: NEGATIVE
Nitrite, UA: NEGATIVE
Protein, UA: NEGATIVE
Spec Grav, UA: 1.02 (ref 1.010–1.025)
Urobilinogen, UA: 0.2 E.U./dL
pH, UA: 6 (ref 5.0–8.0)

## 2019-01-29 LAB — POCT UA - MICROALBUMIN
Albumin/Creatinine Ratio, Urine, POC: 30
Creatinine, POC: 200 mg/dL
Microalbumin Ur, POC: 30 mg/L

## 2019-01-29 NOTE — Progress Notes (Signed)
This visit occurred during the SARS-CoV-2 public health emergency.  Safety protocols were in place, including screening questions prior to the visit, additional usage of staff PPE, and extensive cleaning of exam room while observing appropriate contact time as indicated for disinfecting solutions.  Subjective:     Patient ID: Katelyn Schmidt , female    DOB: 1960/12/30 , 58 y.o.   MRN: [id number]   Chief Complaint  Patient presents with  . Annual Exam  . Diabetes  . Hypertension    HPI  She is here today for a full physical examination. She is not followed by Gyn. She has no specific concerns or complaints at this time.   Diabetes She presents for her follow-up diabetic visit. She has type 2 diabetes mellitus. There are no hypoglycemic associated symptoms. There are no diabetic associated symptoms. Weakness:  There are no hypoglycemic complications. Symptoms are stable. Risk factors for coronary artery disease include diabetes mellitus, dyslipidemia, hypertension, post-menopausal and sedentary lifestyle. She is compliant with treatment most of the time. She participates in exercise intermittently. An ACE inhibitor/angiotensin II receptor blocker is being taken. She does not see a podiatrist.Eye exam is current.  Hypertension This is a chronic problem. The current episode started more than 1 year ago. The problem has been gradually improving since onset. The problem is controlled.     Past Medical History:  Diagnosis Date  . Diabetes mellitus, type 2 ([place])   . Dyslipidemia   . Hypertension   . Menorrhagia    fibroids  . Obesity   . Phlebitis   . Varicose veins      Family History  Problem Relation Age of Onset  . Hypertension Mother   . Hypertension Father   . Diabetes Father   . Heart failure Father   . Stroke Brother   . Heart failure Brother      Current Outpatient Medications:  .  amLODipine (NORVASC) 10 MG tablet, TAKE 1 TABLET BY MOUTH EVERY DAY, Disp: 90 tablet,  Rfl: 1 .  Ascorbic Acid (VITAMIN C) 1000 MG tablet, Take by mouth daily. , Disp: , Rfl:  .  aspirin 81 MG tablet, Take 81 mg by mouth daily., Disp: , Rfl:  .  Fexofenadine HCl (ALLEGRA PO), Take 1 tablet by mouth daily., Disp: , Rfl:  .  loratadine (CLARITIN) 10 MG tablet, Take 10 mg by mouth daily., Disp: , Rfl:  .  metFORMIN (GLUCOPHAGE) 500 MG tablet, TAKE 1 TABLET BY MOUTH TWICE DAILY, Disp: 180 tablet, Rfl: 2 .  Multiple Vitamins-Minerals (MULTIVITAMIN PO), Take 1 tablet by mouth daily., Disp: , Rfl:  .  ONE TOUCH ULTRA TEST test strip, TEST BLOOD SUGAR TWICE DAILY, Disp: 100 each, Rfl: 1 .  progesterone (PROMETRIUM) 100 MG capsule, Take 50 mg by mouth at bedtime. Except Sundays, Disp: , Rfl:  .  rosuvastatin (CRESTOR) 10 MG tablet, TAKE 1 TABLET BY MOUTH DAILY, Disp: 90 tablet, Rfl: 0 .  telmisartan-hydrochlorothiazide (MICARDIS HCT) 40-12.5 MG tablet, Take 1 tablet by mouth daily., Disp: 90 tablet, Rfl: 1   No Known Allergies    The patient states she uses post menopausal status for birth control. Last LMP was No LMP recorded. Patient is postmenopausal.. Negative for Dysmenorrhea  Negative for: breast discharge, breast lump(s), breast pain and breast self exam. Associated symptoms include abnormal vaginal bleeding. Pertinent negatives include abnormal bleeding (hematology), anxiety, decreased libido, depression, difficulty falling sleep, dyspareunia, history of infertility, nocturia, sexual dysfunction, sleep disturbances, urinary incontinence, urinary urgency,  vaginal discharge and vaginal itching. Diet regular.The patient states her exercise level is  intermittent.   . The patient's tobacco use is:  Social History   Tobacco Use  Smoking Status Never Smoker  Smokeless Tobacco Never Used  . She has been exposed to passive smoke. The patient's alcohol use is:  Social History   Substance and Sexual Activity  Alcohol Use Yes   Comment: occas 1x mo   Review of Systems   Constitutional: Negative.   HENT: Negative.   Eyes: Negative.   Respiratory: Negative.   Cardiovascular: Negative.   Gastrointestinal: Negative.   Endocrine: Negative.   Genitourinary: Negative.   Musculoskeletal: Negative.   Skin: Negative.   Allergic/Immunologic: Negative.   Neurological: Negative.  Weakness:   Hematological: Negative.   Psychiatric/Behavioral: Negative.      Today's Vitals   01/29/19 1142  BP: 126/74  Pulse: (!) 57  Temp: 98.2 F (36.8 C)  TempSrc: Oral  Weight: 203 lb 3.2 oz (92.2 kg)  Height: '5\' 4"'$  (1.626 m)   Body mass index is 34.88 kg/m.   Objective:  Physical Exam Vitals signs and nursing note reviewed.  Constitutional:      Appearance: Normal appearance.  HENT:     Head: Normocephalic and atraumatic.     Right Ear: Tympanic membrane, ear canal and external ear normal.     Left Ear: Tympanic membrane, ear canal and external ear normal.     Nose:     Comments: Deferred, masked    Mouth/Throat:     Comments: Deferred, masked Eyes:     Extraocular Movements: Extraocular movements intact.     Conjunctiva/sclera: Conjunctivae normal.     Pupils: Pupils are equal, round, and reactive to light.  Neck:     Musculoskeletal: Normal range of motion and neck supple.  Cardiovascular:     Rate and Rhythm: Normal rate and regular rhythm.     Pulses: Normal pulses.          Dorsalis pedis pulses are 2+ on the right side and 2+ on the left side.       Posterior tibial pulses are 2+ on the right side and 2+ on the left side.     Heart sounds: Normal heart sounds.  Pulmonary:     Effort: Pulmonary effort is normal.     Breath sounds: Normal breath sounds.  Chest:     Breasts: Tanner Score is 5.        Right: Normal.        Left: Normal.  Abdominal:     General: Abdomen is flat. Bowel sounds are normal.     Palpations: Abdomen is soft.  Genitourinary:    Comments: deferred Musculoskeletal: Normal range of motion.  Feet:     Right foot:      Protective Sensation: 5 sites tested. 5 sites sensed.     Skin integrity: Skin integrity normal.     Toenail Condition: Right toenails are normal.     Left foot:     Protective Sensation: 5 sites tested. 5 sites sensed.     Skin integrity: Skin integrity normal.     Toenail Condition: Left toenails are normal.  Skin:    General: Skin is warm and dry.  Neurological:     General: No focal deficit present.     Mental Status: She is alert and oriented to person, place, and time.  Psychiatric:        Mood and Affect: Mood normal.  Behavior: Behavior normal.         Assessment And Plan:     1. Routine general medical examination at health care facility  A full exam was performed.  Importance of monthly self breast exams was discussed with the patient. EKG performed, sinus bradycardia w/ LVH.  PATIENT HAS BEEN ADVISED TO GET 30-45 MINUTES REGULAR EXERCISE NO LESS THAN FOUR TO FIVE DAYS PER WEEK - BOTH WEIGHTBEARING EXERCISES AND AEROBIC ARE RECOMMENDED.  SHE WAS ADVISED TO FOLLOW A HEALTHY DIET WITH AT LEAST SIX FRUITS/VEGGIES PER DAY, DECREASE INTAKE OF RED MEAT, AND TO INCREASE FISH INTAKE TO TWO DAYS PER WEEK.  MEATS/FISH SHOULD NOT BE FRIED, BAKED OR BROILED IS PREFERABLE.  I SUGGEST WEARING SPF 50 SUNSCREEN ON EXPOSED PARTS AND ESPECIALLY WHEN IN THE DIRECT SUNLIGHT FOR AN EXTENDED PERIOD OF TIME.  PLEASE AVOID FAST FOOD RESTAURANTS AND INCREASE YOUR WATER INTAKE.  - EKG 12-Lead - CBC - Hemoglobin A1c - BMP8+EGFR  2. Uncontrolled type 2 diabetes mellitus with hyperglycemia (HCC)  Diabetic foot exam was performed. I DISCUSSED WITH THE PATIENT AT LENGTH REGARDING THE GOALS OF GLYCEMIC CONTROL AND POSSIBLE LONG-TERM COMPLICATIONS.  I  ALSO STRESSED THE IMPORTANCE OF COMPLIANCE WITH HOME GLUCOSE MONITORING, DIETARY RESTRICTIONS INCLUDING AVOIDANCE OF SUGARY DRINKS/PROCESSED FOODS,  ALONG WITH REGULAR EXERCISE.  I  ALSO STRESSED THE IMPORTANCE OF ANNUAL EYE EXAMS, SELF FOOT CARE AND  COMPLIANCE WITH OFFICE VISITS.  - POCT Urinalysis Dipstick (81002) - POCT UA - Microalbumin  3. Essential hypertension  Chronic, well controlled. She will continue with current meds. She is encouraged to avoid adding salt to her foods. She will rto in four months for re-evaluation.   4. Class 1 obesity due to excess calories with serious comorbidity and body mass index (BMI) of 34.0 to 34.9 in adult  Importance of achieving optimal weight to decrease risk of cardiovascular disease and cancers was discussed with the patient in full detail. She is encouraged to start slowly - start with 10 minutes twice daily at least three to four days per week and to gradually build to 30 minutes five days weekly. She was given tips to incorporate more activity into her daily routine - take stairs when possible, park farther away from her job, grocery stores, etc.    Maximino Greenland, MD    THE PATIENT IS ENCOURAGED TO PRACTICE SOCIAL DISTANCING DUE TO THE COVID-19 PANDEMIC.

## 2019-01-29 NOTE — Patient Instructions (Signed)
Health Maintenance, Female Adopting a healthy lifestyle and getting preventive care are important in promoting health and wellness. Ask your health care provider about:  The right schedule for you to have regular tests and exams.  Things you can do on your own to prevent diseases and keep yourself healthy. What should I know about diet, weight, and exercise? Eat a healthy diet   Eat a diet that includes plenty of vegetables, fruits, low-fat dairy products, and lean protein.  Do not eat a lot of foods that are high in solid fats, added sugars, or sodium. Maintain a healthy weight Body mass index (BMI) is used to identify weight problems. It estimates body fat based on height and weight. Your health care provider can help determine your BMI and help you achieve or maintain a healthy weight. Get regular exercise Get regular exercise. This is one of the most important things you can do for your health. Most adults should:  Exercise for at least 150 minutes each week. The exercise should increase your heart rate and make you sweat (moderate-intensity exercise).  Do strengthening exercises at least twice a week. This is in addition to the moderate-intensity exercise.  Spend less time sitting. Even light physical activity can be beneficial. Watch cholesterol and blood lipids Have your blood tested for lipids and cholesterol at 56 years of age, then have this test every 5 years. Have your cholesterol levels checked more often if:  Your lipid or cholesterol levels are high.  You are older than 56 years of age.  You are at high risk for heart disease. What should I know about cancer screening? Depending on your health history and family history, you may need to have cancer screening at various ages. This may include screening for:  Breast cancer.  Cervical cancer.  Colorectal cancer.  Skin cancer.  Lung cancer. What should I know about heart disease, diabetes, and high blood  pressure? Blood pressure and heart disease  High blood pressure causes heart disease and increases the risk of stroke. This is more likely to develop in people who have high blood pressure readings, are of African descent, or are overweight.  Have your blood pressure checked: ? Every 3-5 years if you are 18-39 years of age. ? Every year if you are 40 years old or older. Diabetes Have regular diabetes screenings. This checks your fasting blood sugar level. Have the screening done:  Once every three years after age 40 if you are at a normal weight and have a low risk for diabetes.  More often and at a younger age if you are overweight or have a high risk for diabetes. What should I know about preventing infection? Hepatitis B If you have a higher risk for hepatitis B, you should be screened for this virus. Talk with your health care provider to find out if you are at risk for hepatitis B infection. Hepatitis C Testing is recommended for:  Everyone born from 1945 through 1965.  Anyone with known risk factors for hepatitis C. Sexually transmitted infections (STIs)  Get screened for STIs, including gonorrhea and chlamydia, if: ? You are sexually active and are younger than 56 years of age. ? You are older than 56 years of age and your health care provider tells you that you are at risk for this type of infection. ? Your sexual activity has changed since you were last screened, and you are at increased risk for chlamydia or gonorrhea. Ask your health care provider if   you are at risk.  Ask your health care provider about whether you are at high risk for HIV. Your health care provider may recommend a prescription medicine to help prevent HIV infection. If you choose to take medicine to prevent HIV, you should first get tested for HIV. You should then be tested every 3 months for as long as you are taking the medicine. Pregnancy  If you are about to stop having your period (premenopausal) and  you may become pregnant, seek counseling before you get pregnant.  Take 400 to 800 micrograms (mcg) of folic acid every day if you become pregnant.  Ask for birth control (contraception) if you want to prevent pregnancy. Osteoporosis and menopause Osteoporosis is a disease in which the bones lose minerals and strength with aging. This can result in bone fractures. If you are 65 years old or older, or if you are at risk for osteoporosis and fractures, ask your health care provider if you should:  Be screened for bone loss.  Take a calcium or vitamin D supplement to lower your risk of fractures.  Be given hormone replacement therapy (HRT) to treat symptoms of menopause. Follow these instructions at home: Lifestyle  Do not use any products that contain nicotine or tobacco, such as cigarettes, e-cigarettes, and chewing tobacco. If you need help quitting, ask your health care provider.  Do not use street drugs.  Do not share needles.  Ask your health care provider for help if you need support or information about quitting drugs. Alcohol use  Do not drink alcohol if: ? Your health care provider tells you not to drink. ? You are pregnant, may be pregnant, or are planning to become pregnant.  If you drink alcohol: ? Limit how much you use to 0-1 drink a day. ? Limit intake if you are breastfeeding.  Be aware of how much alcohol is in your drink. In the [place] one drink equals one 12 oz bottle of beer (355 mL), one 5 oz glass of wine (148 mL), or one 1 oz glass of hard liquor (44 mL). General instructions  Schedule regular health, dental, and eye exams.  Stay current with your vaccines.  Tell your health care provider if: ? You often feel depressed. ? You have ever been abused or do not feel safe at home. Summary  Adopting a healthy lifestyle and getting preventive care are important in promoting health and wellness.  Follow your health care provider's instructions about healthy  diet, exercising, and getting tested or screened for diseases.  Follow your health care provider's instructions on monitoring your cholesterol and blood pressure. This information is not intended to replace advice given to you by your health care provider. Make sure you discuss any questions you have with your health care provider. Document Released: 08/24/2010 Document Revised: 02/01/2018 Document Reviewed: 02/01/2018 Elsevier Patient Education  2020 Elsevier Inc.  

## 2019-01-30 LAB — CBC
Hematocrit: 45 % (ref 34.0–46.6)
Hemoglobin: 14.3 g/dL (ref 11.1–15.9)
MCH: 25.7 pg — ABNORMAL LOW (ref 26.6–33.0)
MCHC: 31.8 g/dL (ref 31.5–35.7)
MCV: 81 fL (ref 79–97)
Platelets: 338 10*3/uL (ref 150–450)
RBC: 5.56 x10E6/uL — ABNORMAL HIGH (ref 3.77–5.28)
RDW: 15 % (ref 11.7–15.4)
WBC: 6.3 10*3/uL (ref 3.4–10.8)

## 2019-01-30 LAB — BMP8+EGFR
BUN/Creatinine Ratio: 12 (ref 9–23)
BUN: 11 mg/dL (ref 6–24)
CO2: 26 mmol/L (ref 20–29)
Calcium: 10.7 mg/dL — ABNORMAL HIGH (ref 8.7–10.2)
Chloride: 101 mmol/L (ref 96–106)
Creatinine, Ser: 0.93 mg/dL (ref 0.57–1.00)
GFR calc Af Amer: 79 mL/min/{1.73_m2} (ref >59)
GFR calc non Af Amer: 69 mL/min/{1.73_m2} (ref >59)
Glucose: 72 mg/dL (ref 65–99)
Potassium: 3.7 mmol/L (ref 3.5–5.2)
Sodium: 142 mmol/L (ref 134–144)

## 2019-01-30 LAB — HEMOGLOBIN A1C
Est. average glucose Bld gHb Est-mCnc: 137 mg/dL
Hgb A1c MFr Bld: 6.4 % — ABNORMAL HIGH (ref 4.8–5.6)

## 2019-03-24 ENCOUNTER — Other Ambulatory Visit: Payer: Self-pay | Admitting: Cardiology

## 2019-03-27 ENCOUNTER — Other Ambulatory Visit: Payer: Self-pay

## 2019-03-27 MED ORDER — ROSUVASTATIN CALCIUM 10 MG PO TABS: 10.0000 mg | ORAL_TABLET | Freq: Every day | ORAL | 0 refills | Status: DC

## 2019-05-11 ENCOUNTER — Other Ambulatory Visit: Payer: Self-pay | Admitting: Internal Medicine

## 2019-06-04 ENCOUNTER — Ambulatory Visit: Payer: Commercial Managed Care - PPO | Admitting: Internal Medicine

## 2019-06-04 ENCOUNTER — Other Ambulatory Visit: Payer: Self-pay

## 2019-06-04 ENCOUNTER — Encounter: Payer: Self-pay | Admitting: Internal Medicine

## 2019-06-04 VITALS — BP 128/60 | HR 60 | Temp 98.0°F | Ht 63.6 in | Wt 206.0 lb

## 2019-06-04 DIAGNOSIS — Z6835 Body mass index (BMI) 35.0-35.9, adult: Secondary | ICD-10-CM

## 2019-06-04 DIAGNOSIS — I10 Essential (primary) hypertension: Secondary | ICD-10-CM | POA: Diagnosis not present

## 2019-06-04 DIAGNOSIS — E559 Vitamin D deficiency, unspecified: Secondary | ICD-10-CM | POA: Diagnosis not present

## 2019-06-04 DIAGNOSIS — E1165 Type 2 diabetes mellitus with hyperglycemia: Secondary | ICD-10-CM

## 2019-06-04 NOTE — Progress Notes (Signed)
This visit occurred during the SARS-CoV-2 public health emergency.  Safety protocols were in place, including screening questions prior to the visit, additional usage of staff PPE, and extensive cleaning of exam room while observing appropriate contact time as indicated for disinfecting solutions.  Subjective:     Patient ID: Katelyn Schmidt , female    DOB: April 03, 1960 , 59 y.o.   MRN: [id number]   Chief Complaint  Patient presents with  . Diabetes  . Hypertension    HPI  Diabetes She presents for her follow-up diabetic visit. She has type 2 diabetes mellitus. There are no hypoglycemic associated symptoms. There are no diabetic associated symptoms. Weakness:  There are no hypoglycemic complications. Symptoms are stable. Risk factors for coronary artery disease include diabetes mellitus, dyslipidemia, hypertension, post-menopausal and sedentary lifestyle. She is compliant with treatment most of the time. She participates in exercise intermittently. Her breakfast blood glucose is taken between 7-8 am. Her breakfast blood glucose range is generally 70-90 mg/dl. Eye exam is current.  Hypertension This is a chronic problem. The current episode started more than 1 year ago. The problem has been gradually improving since onset. The problem is controlled. Risk factors for coronary artery disease include diabetes mellitus, obesity, post-menopausal state and sedentary lifestyle. The current treatment provides moderate improvement. Compliance problems include exercise.      Past Medical History:  Diagnosis Date  . Diabetes mellitus, type 2 ([place])   . Dyslipidemia   . Hypertension   . Menorrhagia    fibroids  . Obesity   . Phlebitis   . Varicose veins      Family History  Problem Relation Age of Onset  . Hypertension Mother   . Hypertension Father   . Diabetes Father   . Heart failure Father   . Stroke Brother   . Heart failure Brother      Current Outpatient Medications:  .   amLODipine (NORVASC) 10 MG tablet, TAKE 1 TABLET BY MOUTH EVERY DAY, Disp: 90 tablet, Rfl: 1 .  Ascorbic Acid (VITAMIN C) 1000 MG tablet, Take by mouth daily. , Disp: , Rfl:  .  aspirin 81 MG tablet, Take 81 mg by mouth daily., Disp: , Rfl:  .  Fexofenadine HCl (ALLEGRA PO), Take 1 tablet by mouth daily., Disp: , Rfl:  .  loratadine (CLARITIN) 10 MG tablet, Take 10 mg by mouth daily., Disp: , Rfl:  .  metFORMIN (GLUCOPHAGE) 500 MG tablet, TAKE 1 TABLET BY MOUTH TWICE DAILY, Disp: 180 tablet, Rfl: 2 .  Multiple Vitamins-Minerals (MULTIVITAMIN PO), Take 1 tablet by mouth daily., Disp: , Rfl:  .  ONE TOUCH ULTRA TEST test strip, TEST BLOOD SUGAR TWICE DAILY, Disp: 100 each, Rfl: 1 .  progesterone (PROMETRIUM) 100 MG capsule, Take 50 mg by mouth at bedtime. Except Sundays, Disp: , Rfl:  .  rosuvastatin (CRESTOR) 10 MG tablet, Take 1 tablet (10 mg total) by mouth daily., Disp: 90 tablet, Rfl: 0 .  telmisartan-hydrochlorothiazide (MICARDIS HCT) 40-12.5 MG tablet, TAKE 1 TABLET BY MOUTH DAILY, Disp: 90 tablet, Rfl: 1   No Known Allergies   Review of Systems  Constitutional: Negative.   Respiratory: Negative.   Cardiovascular: Negative.   Gastrointestinal: Negative.   Neurological: Negative.  Weakness:   Psychiatric/Behavioral: Negative.      Today's Vitals   06/04/19 0959  BP: 128/60  Pulse: 60  Temp: 98 F (36.7 C)  TempSrc: Oral  Weight: 206 lb (93.4 kg)  Height: 5' 3.6" (1.615 m)  Body mass index is 35.81 kg/m.   Objective:  Physical Exam Vitals and nursing note reviewed.  Constitutional:      Appearance: Normal appearance.  HENT:     Head: Normocephalic and atraumatic.  Cardiovascular:     Rate and Rhythm: Normal rate and regular rhythm.     Heart sounds: Normal heart sounds.  Pulmonary:     Effort: Pulmonary effort is normal.     Breath sounds: Normal breath sounds.  Skin:    General: Skin is warm.  Neurological:     General: No focal deficit present.     Mental  Status: She is alert.  Psychiatric:        Mood and Affect: Mood normal.        Behavior: Behavior normal.         Assessment And Plan:     1. Uncontrolled type 2 diabetes mellitus with hyperglycemia (HCC)  Chronic, well controlled. She will continue with current meds. She is encouraged to avoid adding salt to her foods. I will check labs as listed below. I will make further recommendations once her labs are available for review.  She will rto in four months for her next diabetes evaluation.    - BMP8+EGFR - Hemoglobin A1c  2. Essential hypertension  Chronic, well controlled. She will continue with current meds. She is encouraged to avoid adding salt to her foods.   3. Vitamin D deficiency disease  I WILL CHECK A VIT D LEVEL AND SUPPLEMENT AS NEEDED.  ALSO ENCOURAGED TO SPEND 15 MINUTES IN THE SUN DAILY.   - Vitamin D (25 hydroxy)  4. Class 2 severe obesity due to excess calories with serious comorbidity and body mass index (BMI) of 35.0 to 35.9 in adult [facility])  She is encouraged to strive for BMI less than 30 to decrease cardiac risk. Advised to aim to exercise at least 30 minutes five days per week.   Maximino Greenland, MD    THE PATIENT IS ENCOURAGED TO PRACTICE SOCIAL DISTANCING DUE TO THE COVID-19 PANDEMIC.

## 2019-06-04 NOTE — Patient Instructions (Signed)
Diabetes Mellitus and Exercise Exercising regularly is important for your overall health, especially when you have diabetes (diabetes mellitus). Exercising is not only about losing weight. It has many other health benefits, such as increasing muscle strength and bone density and reducing body fat and stress. This leads to improved fitness, flexibility, and endurance, all of which result in better overall health. Exercise has additional benefits for people with diabetes, including:  Reducing appetite.  Helping to lower and control blood glucose.  Lowering blood pressure.  Helping to control amounts of fatty substances (lipids) in the blood, such as cholesterol and triglycerides.  Helping the body to respond better to insulin (improving insulin sensitivity).  Reducing how much insulin the body needs.  Decreasing the risk for heart disease by: ? Lowering cholesterol and triglyceride levels. ? Increasing the levels of good cholesterol. ? Lowering blood glucose levels. What is my activity plan? Your health care provider or certified diabetes educator can help you make a plan for the type and frequency of exercise (activity plan) that works for you. Make sure that you:  Do at least 150 minutes of moderate-intensity or vigorous-intensity exercise each week. This could be brisk walking, biking, or water aerobics. ? Do stretching and strength exercises, such as yoga or weightlifting, at least 2 times a week. ? Spread out your activity over at least 3 days of the week.  Get some form of physical activity every day. ? Do not go more than 2 days in a row without some kind of physical activity. ? Avoid being inactive for more than 30 minutes at a time. Take frequent breaks to walk or stretch.  Choose a type of exercise or activity that you enjoy, and set realistic goals.  Start slowly, and gradually increase the intensity of your exercise over time. What do I need to know about managing my  diabetes?   Check your blood glucose before and after exercising. ? If your blood glucose is 240 mg/dL (13.3 mmol/L) or higher before you exercise, check your urine for ketones. If you have ketones in your urine, do not exercise until your blood glucose returns to normal. ? If your blood glucose is 100 mg/dL (5.6 mmol/L) or lower, eat a snack containing 15-20 grams of carbohydrate. Check your blood glucose 15 minutes after the snack to make sure that your level is above 100 mg/dL (5.6 mmol/L) before you start your exercise.  Know the symptoms of low blood glucose (hypoglycemia) and how to treat it. Your risk for hypoglycemia increases during and after exercise. Common symptoms of hypoglycemia can include: ? Hunger. ? Anxiety. ? Sweating and feeling clammy. ? Confusion. ? Dizziness or feeling light-headed. ? Increased heart rate or palpitations. ? Blurry vision. ? Tingling or numbness around the mouth, lips, or tongue. ? Tremors or shakes. ? Irritability.  Keep a rapid-acting carbohydrate snack available before, during, and after exercise to help prevent or treat hypoglycemia.  Avoid injecting insulin into areas of the body that are going to be exercised. For example, avoid injecting insulin into: ? The arms, when playing tennis. ? The legs, when jogging.  Keep records of your exercise habits. Doing this can help you and your health care provider adjust your diabetes management plan as needed. Write down: ? Food that you eat before and after you exercise. ? Blood glucose levels before and after you exercise. ? The type and amount of exercise you have done. ? When your insulin is expected to peak, if you use   insulin. Avoid exercising at times when your insulin is peaking.  When you start a new exercise or activity, work with your health care provider to make sure the activity is safe for you, and to adjust your insulin, medicines, or food intake as needed.  Drink plenty of water while  you exercise to prevent dehydration or heat stroke. Drink enough fluid to keep your urine clear or pale yellow. Summary  Exercising regularly is important for your overall health, especially when you have diabetes (diabetes mellitus).  Exercising has many health benefits, such as increasing muscle strength and bone density and reducing body fat and stress.  Your health care provider or certified diabetes educator can help you make a plan for the type and frequency of exercise (activity plan) that works for you.  When you start a new exercise or activity, work with your health care provider to make sure the activity is safe for you, and to adjust your insulin, medicines, or food intake as needed. This information is not intended to replace advice given to you by your health care provider. Make sure you discuss any questions you have with your health care provider. Document Revised: 09/02/2016 Document Reviewed: 07/21/2015 Elsevier Patient Education  2020 Elsevier Inc.  

## 2019-06-05 LAB — BMP8+EGFR
BUN/Creatinine Ratio: 13 (ref 9–23)
BUN: 11 mg/dL (ref 6–24)
CO2: 19 mmol/L — ABNORMAL LOW (ref 20–29)
Calcium: 10.4 mg/dL — ABNORMAL HIGH (ref 8.7–10.2)
Chloride: 106 mmol/L (ref 96–106)
Creatinine, Ser: 0.85 mg/dL (ref 0.57–1.00)
GFR calc Af Amer: 88 mL/min/{1.73_m2} (ref >59)
GFR calc non Af Amer: 76 mL/min/{1.73_m2} (ref >59)
Glucose: 78 mg/dL (ref 65–99)
Potassium: 4.4 mmol/L (ref 3.5–5.2)
Sodium: 143 mmol/L (ref 134–144)

## 2019-06-05 LAB — VITAMIN D 25 HYDROXY (VIT D DEFICIENCY, FRACTURES): Vit D, 25-Hydroxy: 46 ng/mL (ref 30.0–100.0)

## 2019-06-05 LAB — HEMOGLOBIN A1C
Est. average glucose Bld gHb Est-mCnc: 143 mg/dL
Hgb A1c MFr Bld: 6.6 % — ABNORMAL HIGH (ref 4.8–5.6)

## 2019-07-16 ENCOUNTER — Other Ambulatory Visit: Payer: Self-pay | Admitting: Cardiology

## 2019-08-17 ENCOUNTER — Other Ambulatory Visit: Payer: Self-pay | Admitting: Nurse Practitioner

## 2019-08-19 ENCOUNTER — Other Ambulatory Visit: Payer: Self-pay | Admitting: Internal Medicine

## 2019-08-28 ENCOUNTER — Ambulatory Visit: Payer: Commercial Managed Care - PPO | Admitting: Internal Medicine

## 2019-08-28 ENCOUNTER — Other Ambulatory Visit: Payer: Self-pay

## 2019-08-28 ENCOUNTER — Encounter: Payer: Self-pay | Admitting: Internal Medicine

## 2019-08-28 VITALS — Temp 98.4°F | Ht 63.6 in | Wt 208.8 lb

## 2019-08-28 DIAGNOSIS — R42 Dizziness and giddiness: Secondary | ICD-10-CM

## 2019-08-28 DIAGNOSIS — Z6836 Body mass index (BMI) 36.0-36.9, adult: Secondary | ICD-10-CM

## 2019-08-28 NOTE — Patient Instructions (Signed)

## 2019-09-16 NOTE — Progress Notes (Signed)
This visit occurred during the SARS-CoV-2 public health emergency.  Safety protocols were in place, including screening questions prior to the visit, additional usage of staff PPE, and extensive cleaning of exam room while observing appropriate contact time as indicated for disinfecting solutions.  Subjective:     Patient ID: Katelyn Schmidt , female    DOB: 1960/11/05 , 59 y.o.   MRN: [id number]   Chief Complaint  Patient presents with  . Dizziness    HPI  Dizziness This is a new problem. The current episode started 1 to 4 weeks ago. The problem occurs intermittently. The problem has been unchanged. Pertinent negatives include no chest pain, headaches, nausea, rash, sore throat or visual change. The symptoms are aggravated by walking.     Past Medical History:  Diagnosis Date  . Diabetes mellitus, type 2 ([place])   . Dyslipidemia   . Hypertension   . Menorrhagia    fibroids  . Obesity   . Phlebitis   . Varicose veins      Family History  Problem Relation Age of Onset  . Hypertension Mother   . Hypertension Father   . Diabetes Father   . Heart failure Father   . Stroke Brother   . Heart failure Brother      Current Outpatient Medications:  .  amLODipine (NORVASC) 10 MG tablet, TAKE 1 TABLET BY MOUTH EVERY DAY, Disp: 90 tablet, Rfl: 1 .  Ascorbic Acid (VITAMIN C) 1000 MG tablet, Take by mouth daily. , Disp: , Rfl:  .  aspirin 81 MG tablet, Take 81 mg by mouth daily., Disp: , Rfl:  .  Fexofenadine HCl (ALLEGRA PO), Take 1 tablet by mouth daily., Disp: , Rfl:  .  loratadine (CLARITIN) 10 MG tablet, Take 10 mg by mouth daily., Disp: , Rfl:  .  metFORMIN (GLUCOPHAGE) 500 MG tablet, TAKE 1 TABLET BY MOUTH TWICE DAILY, Disp: 180 tablet, Rfl: 2 .  Multiple Vitamins-Minerals (MULTIVITAMIN PO), Take 1 tablet by mouth daily., Disp: , Rfl:  .  ONE TOUCH ULTRA TEST test strip, TEST BLOOD SUGAR TWICE DAILY, Disp: 100 each, Rfl: 1 .  progesterone (PROMETRIUM) 100 MG capsule, Take 50  mg by mouth at bedtime. Except Sundays, Disp: , Rfl:  .  rosuvastatin (CRESTOR) 10 MG tablet, TAKE 1 TABLET(10 MG) BY MOUTH DAILY, Disp: 90 tablet, Rfl: 0 .  telmisartan-hydrochlorothiazide (MICARDIS HCT) 40-12.5 MG tablet, TAKE 1 TABLET BY MOUTH DAILY, Disp: 90 tablet, Rfl: 1 .  meclizine (ANTIVERT) 25 MG tablet, Take 25 mg by mouth every 6 (six) hours as needed., Disp: , Rfl:    No Known Allergies   Review of Systems  Constitutional: Negative.   HENT: Negative for sore throat.   Respiratory: Negative.   Cardiovascular: Negative.  Negative for chest pain.  Gastrointestinal: Negative.  Negative for nausea.  Skin: Negative for rash.  Neurological: Positive for dizziness. Negative for headaches.  Psychiatric/Behavioral: Negative.      Today's Vitals   08/28/19 1208  Temp: 98.4 F (36.9 C)  TempSrc: Oral  Weight: 208 lb 12.8 oz (94.7 kg)  Height: 5' 3.6" (1.615 m)   Body mass index is 36.29 kg/m.   Objective:  Physical Exam Vitals and nursing note reviewed.  Constitutional:      Appearance: Normal appearance. She is obese.  HENT:     Head: Normocephalic and atraumatic.     Right Ear: Tympanic membrane, ear canal and external ear normal.     Left Ear: Tympanic membrane,  ear canal and external ear normal.  Cardiovascular:     Rate and Rhythm: Normal rate and regular rhythm.     Heart sounds: Normal heart sounds.  Pulmonary:     Effort: Pulmonary effort is normal.     Breath sounds: Normal breath sounds.  Skin:    General: Skin is warm.  Neurological:     General: No focal deficit present.     Mental Status: She is alert.  Psychiatric:        Mood and Affect: Mood normal.        Behavior: Behavior normal.         Assessment And Plan:     1. Dizziness  Comments: Orthostatics performed, negative. She is encouraged to stay well hydrated. She was given rx meclizine to take prn. She is encouraged to notify me if her sx persist or if the meclizine is ineffective.    2. Class 2 severe obesity due to excess calories with serious comorbidity and body mass index (BMI) of 36.0 to 36.9 in adult [facility])   She is encouraged to strive for BMI less than 30 to decrease cardiac risk. Advised to aim for at least 150 minutes of exercise per week.   Patient was given opportunity to ask questions. Patient verbalized understanding of the plan and was able to repeat key elements of the plan. All questions were answered to their satisfaction.  Maximino Greenland, MD   I, Maximino Greenland, MD, have reviewed all documentation for this visit. The documentation on 09/16/19 for the exam, diagnosis, procedures, and orders are all accurate and complete.  THE PATIENT IS ENCOURAGED TO PRACTICE SOCIAL DISTANCING DUE TO THE COVID-19 PANDEMIC.

## 2019-09-26 ENCOUNTER — Encounter: Payer: Self-pay | Admitting: Internal Medicine

## 2019-09-26 ENCOUNTER — Ambulatory Visit: Payer: Commercial Managed Care - PPO | Admitting: Internal Medicine

## 2019-09-26 ENCOUNTER — Other Ambulatory Visit: Payer: Self-pay

## 2019-09-26 VITALS — Temp 98.3°F | Ht 63.6 in | Wt 203.0 lb

## 2019-09-26 DIAGNOSIS — Z0000 Encounter for general adult medical examination without abnormal findings: Secondary | ICD-10-CM

## 2019-09-26 DIAGNOSIS — E1165 Type 2 diabetes mellitus with hyperglycemia: Secondary | ICD-10-CM | POA: Diagnosis not present

## 2019-09-26 DIAGNOSIS — I10 Essential (primary) hypertension: Secondary | ICD-10-CM

## 2019-09-26 DIAGNOSIS — Z6835 Body mass index (BMI) 35.0-35.9, adult: Secondary | ICD-10-CM

## 2019-09-26 DIAGNOSIS — Z23 Encounter for immunization: Secondary | ICD-10-CM

## 2019-09-26 DIAGNOSIS — E66812 Obesity, class 2: Secondary | ICD-10-CM

## 2019-09-26 LAB — POCT URINALYSIS DIPSTICK
Bilirubin, UA: NEGATIVE
Glucose, UA: NEGATIVE
Ketones, UA: NEGATIVE
Nitrite, UA: NEGATIVE
Protein, UA: NEGATIVE
Spec Grav, UA: 1.02 (ref 1.010–1.025)
Urobilinogen, UA: 0.2 E.U./dL
pH, UA: 6 (ref 5.0–8.0)

## 2019-09-26 LAB — POCT UA - MICROALBUMIN
Albumin/Creatinine Ratio, Urine, POC: <30
Creatinine, POC: 100 mg/dL
Microalbumin Ur, POC: 10 mg/L

## 2019-09-26 MED ORDER — TETANUS-DIPHTH-ACELL PERTUSSIS 5-2.5-18.5 LF-MCG/0.5 IM SUSP
0.5000 mL | Freq: Once | INTRAMUSCULAR | Status: AC
  Administered 2019-09-26: 0.5 mL via INTRAMUSCULAR

## 2019-09-26 MED ORDER — NYSTATIN 100000 UNIT/GM EX CREA: TOPICAL_CREAM | CUTANEOUS | 0 refills | Status: DC

## 2019-09-26 NOTE — Patient Instructions (Signed)
Health Maintenance, Female Adopting a healthy lifestyle and getting preventive care are important in promoting health and wellness. Ask your health care provider about:  The right schedule for you to have regular tests and exams.  Things you can do on your own to prevent diseases and keep yourself healthy. What should I know about diet, weight, and exercise? Eat a healthy diet   Eat a diet that includes plenty of vegetables, fruits, low-fat dairy products, and lean protein.  Do not eat a lot of foods that are high in solid fats, added sugars, or sodium. Maintain a healthy weight Body mass index (BMI) is used to identify weight problems. It estimates body fat based on height and weight. Your health care provider can help determine your BMI and help you achieve or maintain a healthy weight. Get regular exercise Get regular exercise. This is one of the most important things you can do for your health. Most adults should:  Exercise for at least 150 minutes each week. The exercise should increase your heart rate and make you sweat (moderate-intensity exercise).  Do strengthening exercises at least twice a week. This is in addition to the moderate-intensity exercise.  Spend less time sitting. Even light physical activity can be beneficial. Watch cholesterol and blood lipids Have your blood tested for lipids and cholesterol at 57 years of age, then have this test every 5 years. Have your cholesterol levels checked more often if:  Your lipid or cholesterol levels are high.  You are older than 57 years of age.  You are at high risk for heart disease. What should I know about cancer screening? Depending on your health history and family history, you may need to have cancer screening at various ages. This may include screening for:  Breast cancer.  Cervical cancer.  Colorectal cancer.  Skin cancer.  Lung cancer. What should I know about heart disease, diabetes, and high blood  pressure? Blood pressure and heart disease  High blood pressure causes heart disease and increases the risk of stroke. This is more likely to develop in people who have high blood pressure readings, are of African descent, or are overweight.  Have your blood pressure checked: ? Every 3-5 years if you are 18-39 years of age. ? Every year if you are 40 years old or older. Diabetes Have regular diabetes screenings. This checks your fasting blood sugar level. Have the screening done:  Once every three years after age 40 if you are at a normal weight and have a low risk for diabetes.  More often and at a younger age if you are overweight or have a high risk for diabetes. What should I know about preventing infection? Hepatitis B If you have a higher risk for hepatitis B, you should be screened for this virus. Talk with your health care provider to find out if you are at risk for hepatitis B infection. Hepatitis C Testing is recommended for:  Everyone born from 1945 through 1965.  Anyone with known risk factors for hepatitis C. Sexually transmitted infections (STIs)  Get screened for STIs, including gonorrhea and chlamydia, if: ? You are sexually active and are younger than 57 years of age. ? You are older than 57 years of age and your health care provider tells you that you are at risk for this type of infection. ? Your sexual activity has changed since you were last screened, and you are at increased risk for chlamydia or gonorrhea. Ask your health care provider if   you are at risk.  Ask your health care provider about whether you are at high risk for HIV. Your health care provider may recommend a prescription medicine to help prevent HIV infection. If you choose to take medicine to prevent HIV, you should first get tested for HIV. You should then be tested every 3 months for as long as you are taking the medicine. Pregnancy  If you are about to stop having your period (premenopausal) and  you may become pregnant, seek counseling before you get pregnant.  Take 400 to 800 micrograms (mcg) of folic acid every day if you become pregnant.  Ask for birth control (contraception) if you want to prevent pregnancy. Osteoporosis and menopause Osteoporosis is a disease in which the bones lose minerals and strength with aging. This can result in bone fractures. If you are 65 years old or older, or if you are at risk for osteoporosis and fractures, ask your health care provider if you should:  Be screened for bone loss.  Take a calcium or vitamin D supplement to lower your risk of fractures.  Be given hormone replacement therapy (HRT) to treat symptoms of menopause. Follow these instructions at home: Lifestyle  Do not use any products that contain nicotine or tobacco, such as cigarettes, e-cigarettes, and chewing tobacco. If you need help quitting, ask your health care provider.  Do not use street drugs.  Do not share needles.  Ask your health care provider for help if you need support or information about quitting drugs. Alcohol use  Do not drink alcohol if: ? Your health care provider tells you not to drink. ? You are pregnant, may be pregnant, or are planning to become pregnant.  If you drink alcohol: ? Limit how much you use to 0-1 drink a day. ? Limit intake if you are breastfeeding.  Be aware of how much alcohol is in your drink. In the [place] one drink equals one 12 oz bottle of beer (355 mL), one 5 oz glass of wine (148 mL), or one 1 oz glass of hard liquor (44 mL). General instructions  Schedule regular health, dental, and eye exams.  Stay current with your vaccines.  Tell your health care provider if: ? You often feel depressed. ? You have ever been abused or do not feel safe at home. Summary  Adopting a healthy lifestyle and getting preventive care are important in promoting health and wellness.  Follow your health care provider's instructions about healthy  diet, exercising, and getting tested or screened for diseases.  Follow your health care provider's instructions on monitoring your cholesterol and blood pressure. This information is not intended to replace advice given to you by your health care provider. Make sure you discuss any questions you have with your health care provider. Document Revised: 02/01/2018 Document Reviewed: 02/01/2018 Elsevier Patient Education  2020 [employer]  

## 2019-09-26 NOTE — Progress Notes (Signed)
I,Katawbba Wiggins,acting as a Education administrator for Maximino Greenland, MD.,have documented all relevant documentation on the behalf of Maximino Greenland, MD,as directed by  Maximino Greenland, MD while in the presence of Maximino Greenland, MD.  This visit occurred during the SARS-CoV-2 public health emergency.  Safety protocols were in place, including screening questions prior to the visit, additional usage of staff PPE, and extensive cleaning of exam room while observing appropriate contact time as indicated for disinfecting solutions.  Subjective:     Patient ID: Katelyn Schmidt , female    DOB: 1960/09/10 , 59 y.o.   MRN: [id number]   Chief Complaint  Patient presents with  . Annual Exam  . Diabetes  . Hypertension    HPI  She is here today for a full physical examination. She is not followed by Gyn. She has no specific concerns or complaints at this time.   Diabetes She presents for her follow-up diabetic visit. She has type 2 diabetes mellitus. There are no hypoglycemic associated symptoms. There are no diabetic associated symptoms. Weakness:  There are no hypoglycemic complications. Symptoms are stable. Risk factors for coronary artery disease include diabetes mellitus, dyslipidemia, hypertension, post-menopausal and sedentary lifestyle. She is compliant with treatment most of the time. She participates in exercise intermittently. An ACE inhibitor/angiotensin II receptor blocker is being taken. She does not see a podiatrist.Eye exam is current.  Hypertension This is a chronic problem. The current episode started more than 1 year ago. The problem has been gradually improving since onset. The problem is controlled.     Past Medical History:  Diagnosis Date  . Diabetes mellitus, type 2 ([place])   . Dyslipidemia   . Hypertension   . Menorrhagia    fibroids  . Obesity   . Phlebitis   . Varicose veins      Family History  Problem Relation Age of Onset  . Hypertension Mother   . Hypertension  Father   . Diabetes Father   . Heart failure Father   . Stroke Brother   . Heart failure Brother      Current Outpatient Medications:  .  amLODipine (NORVASC) 10 MG tablet, TAKE 1 TABLET BY MOUTH EVERY DAY, Disp: 90 tablet, Rfl: 1 .  aspirin 81 MG tablet, Take 81 mg by mouth daily., Disp: , Rfl:  .  Fexofenadine HCl (ALLEGRA PO), Take 1 tablet by mouth daily., Disp: , Rfl:  .  metFORMIN (GLUCOPHAGE) 500 MG tablet, TAKE 1 TABLET BY MOUTH TWICE DAILY, Disp: 180 tablet, Rfl: 2 .  Multiple Vitamins-Minerals (MULTIVITAMIN PO), Take 1 tablet by mouth daily., Disp: , Rfl:  .  ONE TOUCH ULTRA TEST test strip, TEST BLOOD SUGAR TWICE DAILY, Disp: 100 each, Rfl: 1 .  progesterone (PROMETRIUM) 100 MG capsule, Take 50 mg by mouth at bedtime. Except Sundays, Disp: , Rfl:  .  rosuvastatin (CRESTOR) 10 MG tablet, TAKE 1 TABLET(10 MG) BY MOUTH DAILY, Disp: 90 tablet, Rfl: 0 .  telmisartan-hydrochlorothiazide (MICARDIS HCT) 40-12.5 MG tablet, TAKE 1 TABLET BY MOUTH DAILY, Disp: 90 tablet, Rfl: 1 .  Ascorbic Acid (VITAMIN C) 1000 MG tablet, Take by mouth daily. , Disp: , Rfl:  .  meclizine (ANTIVERT) 25 MG tablet, Take 25 mg by mouth every 6 (six) hours as needed. (Patient not taking: Reported on 09/25/2019), Disp: , Rfl:  .  nystatin cream (MYCOSTATIN), Apply 1 application topically 2 times per day to affected area, Disp: 30 g, Rfl: 0   No Known  Allergies    The patient states she uses status post hysterectomy for birth control. Last LMP was No LMP recorded. Patient is postmenopausal.. Negative for Dysmenorrhea. Negative for: breast discharge, breast lump(s), breast pain and breast self exam. Associated symptoms include abnormal vaginal bleeding. Pertinent negatives include abnormal bleeding (hematology), anxiety, decreased libido, depression, difficulty falling sleep, dyspareunia, history of infertility, nocturia, sexual dysfunction, sleep disturbances, urinary incontinence, urinary urgency, vaginal discharge  and vaginal itching. Diet regular.The patient states her exercise level is    . The patient's tobacco use is:  Social History   Tobacco Use  Smoking Status Never Smoker  Smokeless Tobacco Never Used  . She has been exposed to passive smoke. The patient's alcohol use is:  Social History   Substance and Sexual Activity  Alcohol Use Yes   Comment: occas 1x mo      Review of Systems  Constitutional: Negative.   HENT: Negative.   Eyes: Negative.   Respiratory: Negative.   Cardiovascular: Negative.   Gastrointestinal: Negative.   Endocrine: Negative.   Genitourinary: Negative.   Musculoskeletal: Negative.   Skin: Negative.   Allergic/Immunologic: Negative.   Neurological: Negative.  Weakness:   Hematological: Negative.   Psychiatric/Behavioral: Negative.      Today's Vitals   09/26/19 1029  Temp: 98.3 F (36.8 C)  TempSrc: Oral  Weight: 203 lb (92.1 kg)  Height: 5' 3.6" (1.615 m)   Body mass index is 35.28 kg/m.  Wt Readings from Last 3 Encounters:  09/26/19 203 lb (92.1 kg)  08/28/19 208 lb 12.8 oz (94.7 kg)  06/04/19 206 lb (93.4 kg)   Objective:  Physical Exam Constitutional:      General: She is not in acute distress.    Appearance: Normal appearance. She is well-developed. She is obese.  HENT:     Head: Normocephalic and atraumatic.     Right Ear: Hearing, tympanic membrane, ear canal and external ear normal. There is no impacted cerumen.     Left Ear: Hearing, tympanic membrane, ear canal and external ear normal. There is no impacted cerumen.     Nose:     Comments: Deferred, masked    Mouth/Throat:     Comments: Deferred, masked Eyes:     General: Lids are normal.     Extraocular Movements: Extraocular movements intact.     Conjunctiva/sclera: Conjunctivae normal.     Pupils: Pupils are equal, round, and reactive to light.     Funduscopic exam:    Right eye: No papilledema.        Left eye: No papilledema.  Neck:     Thyroid: No thyroid mass.      Vascular: No carotid bruit.  Cardiovascular:     Rate and Rhythm: Normal rate and regular rhythm.     Pulses: Normal pulses.          Dorsalis pedis pulses are 2+ on the right side and 2+ on the left side.     Heart sounds: Normal heart sounds. No murmur heard.   Pulmonary:     Effort: Pulmonary effort is normal.     Breath sounds: Normal breath sounds.  Chest:     Breasts: Tanner Score is 5.        Right: Normal.        Left: Normal.     Comments: Sl erythema right nipple. Also with whitish substance around right nipple.  Abdominal:     General: Bowel sounds are normal. There is no distension.  Palpations: Abdomen is soft.     Tenderness: There is no abdominal tenderness.     Comments: Rounded, soft  Genitourinary:    Rectum: Guaiac result negative.  Musculoskeletal:        General: No swelling. Normal range of motion.     Cervical back: Full passive range of motion without pain, normal range of motion and neck supple.     Right lower leg: No edema.     Left lower leg: No edema.  Feet:     Right foot:     Protective Sensation: 5 sites tested. 5 sites sensed.     Skin integrity: Skin integrity normal.     Toenail Condition: Right toenails are normal.     Left foot:     Protective Sensation: 5 sites tested. 5 sites sensed.     Skin integrity: Skin integrity normal.     Toenail Condition: Left toenails are normal.  Skin:    General: Skin is warm and dry.     Capillary Refill: Capillary refill takes less than 2 seconds.  Neurological:     General: No focal deficit present.     Mental Status: She is alert and oriented to person, place, and time.     Cranial Nerves: No cranial nerve deficit.     Sensory: No sensory deficit.  Psychiatric:        Mood and Affect: Mood normal.        Behavior: Behavior normal.        Thought Content: Thought content normal.        Judgment: Judgment normal.         Assessment And Plan:     1. Routine general medical examination at  health care facility Comments: A full exam was performed.  Importance of monthly self breast exams was discussed with the patient. She no longer gets pap smears, s/p hysterectomy.PATIENT IS ADVISED TO GET 30-45 MINUTES REGULAR EXERCISE NO LESS THAN FOUR TO FIVE DAYS PER WEEK - BOTH WEIGHTBEARING EXERCISES AND AEROBIC ARE RECOMMENDED.  PATIENT IS ADVISED TO FOLLOW A HEALTHY DIET WITH AT LEAST SIX FRUITS/VEGGIES PER DAY, DECREASE INTAKE OF RED MEAT, AND TO INCREASE FISH INTAKE TO TWO DAYS PER WEEK.  MEATS/FISH SHOULD NOT BE FRIED, BAKED OR BROILED IS PREFERABLE.  I SUGGEST WEARING SPF 50 SUNSCREEN ON EXPOSED PARTS AND ESPECIALLY WHEN IN THE DIRECT SUNLIGHT FOR AN EXTENDED PERIOD OF TIME.  PLEASE AVOID FAST FOOD RESTAURANTS AND INCREASE YOUR WATER INTAKE.    - Hemoglobin A1c - Hepatitis C antibody - VITAMIN D 25 Hydroxy (Vit-D Deficiency, Fractures) - CBC - CMP14+EGFR - Lipid panel - TSH  2. Uncontrolled type 2 diabetes mellitus with hyperglycemia ([place]) Comments: Diabetic foot exam was performed.  I DISCUSSED WITH THE PATIENT AT LENGTH REGARDING THE GOALS OF GLYCEMIC CONTROL AND POSSIBLE LONG-TERM COMPLICATIONS.  I  ALSO STRESSED THE IMPORTANCE OF COMPLIANCE WITH HOME GLUCOSE MONITORING, DIETARY RESTRICTIONS INCLUDING AVOIDANCE OF SUGARY DRINKS/PROCESSED FOODS,  ALONG WITH REGULAR EXERCISE.  I  ALSO STRESSED THE IMPORTANCE OF ANNUAL EYE EXAMS, SELF FOOT CARE AND COMPLIANCE WITH OFFICE VISITS.  - POCT Urinalysis Dipstick (81002) - POCT UA - Microalbumin  3. Essential hypertension Comments: Chronic, well controlled. She will continue with current meds. She is encouraged to avoid adding salt to her foods. EKG performed, sinus bradycardia with HR 45. She will rto in six months for re-evaluation.   - EKG 12-Lead  4. Class 2 severe obesity due to excess calories with serious  comorbidity and body mass index (BMI) of 35.0 to 35.9 in adult [facility])  She is encouraged to strive for BMI less than 30 to  decrease cardiac risk. Advised to aim for at least 150 minutes of exercise per week.  5. Immunization due - Tdap (BOOSTRIX) injection 0.5 mL  Patient was given opportunity to ask questions. Patient verbalized understanding of the plan and was able to repeat key elements of the plan. All questions were answered to their satisfaction.   Maximino Greenland, MD   I, Maximino Greenland, MD, have reviewed all documentation for this visit. The documentation on 10/05/19 for the exam, diagnosis, procedures, and orders are all accurate and complete.  THE PATIENT IS ENCOURAGED TO PRACTICE SOCIAL DISTANCING DUE TO THE COVID-19 PANDEMIC.

## 2019-09-27 LAB — CMP14+EGFR
ALT: 17 IU/L (ref 0–32)
AST: 15 IU/L (ref 0–40)
Albumin/Globulin Ratio: 1.8 (ref 1.2–2.2)
Albumin: 4.2 g/dL (ref 3.8–4.9)
Alkaline Phosphatase: 78 IU/L (ref 48–121)
BUN/Creatinine Ratio: 12 (ref 9–23)
BUN: 9 mg/dL (ref 6–24)
Bilirubin Total: 0.3 mg/dL (ref 0.0–1.2)
CO2: 24 mmol/L (ref 20–29)
Calcium: 10.6 mg/dL — ABNORMAL HIGH (ref 8.7–10.2)
Chloride: 104 mmol/L (ref 96–106)
Creatinine, Ser: 0.77 mg/dL (ref 0.57–1.00)
GFR calc Af Amer: 99 mL/min/{1.73_m2} (ref >59)
GFR calc non Af Amer: 86 mL/min/{1.73_m2} (ref >59)
Globulin, Total: 2.3 g/dL (ref 1.5–4.5)
Glucose: 85 mg/dL (ref 65–99)
Potassium: 3.7 mmol/L (ref 3.5–5.2)
Sodium: 142 mmol/L (ref 134–144)
Total Protein: 6.5 g/dL (ref 6.0–8.5)

## 2019-09-27 LAB — LIPID PANEL
Chol/HDL Ratio: 3.6 ratio (ref 0.0–4.4)
Cholesterol, Total: 124 mg/dL (ref 100–199)
HDL: 34 mg/dL — ABNORMAL LOW (ref >39)
LDL Chol Calc (NIH): 76 mg/dL (ref 0–99)
Triglycerides: 65 mg/dL (ref 0–149)
VLDL Cholesterol Cal: 14 mg/dL (ref 5–40)

## 2019-09-27 LAB — HEMOGLOBIN A1C
Est. average glucose Bld gHb Est-mCnc: 131 mg/dL
Hgb A1c MFr Bld: 6.2 % — ABNORMAL HIGH (ref 4.8–5.6)

## 2019-09-27 LAB — HEPATITIS C ANTIBODY: Hep C Virus Ab: 0.2 s/co ratio (ref 0.0–0.9)

## 2019-09-27 LAB — CBC
Hematocrit: 42.7 % (ref 34.0–46.6)
Hemoglobin: 13.5 g/dL (ref 11.1–15.9)
MCH: 25.7 pg — ABNORMAL LOW (ref 26.6–33.0)
MCHC: 31.6 g/dL (ref 31.5–35.7)
MCV: 81 fL (ref 79–97)
Platelets: 286 10*3/uL (ref 150–450)
RBC: 5.26 x10E6/uL (ref 3.77–5.28)
RDW: 14.6 % (ref 11.7–15.4)
WBC: 4.8 10*3/uL (ref 3.4–10.8)

## 2019-09-27 LAB — VITAMIN D 25 HYDROXY (VIT D DEFICIENCY, FRACTURES): Vit D, 25-Hydroxy: 42.7 ng/mL (ref 30.0–100.0)

## 2019-09-27 LAB — TSH: TSH: 1.46 u[IU]/mL (ref 0.450–4.500)

## 2019-10-04 ENCOUNTER — Other Ambulatory Visit: Payer: Self-pay

## 2019-10-04 ENCOUNTER — Other Ambulatory Visit: Payer: Commercial Managed Care - PPO

## 2019-10-04 ENCOUNTER — Telehealth: Payer: Self-pay

## 2019-10-04 NOTE — Telephone Encounter (Signed)
The pt was called and scheduled a lab appt for SPEP and PTH intact because of elevated calcium level.

## 2019-10-08 LAB — PROTEIN ELECTROPHORESIS, SERUM
A/G Ratio: 1.2 (ref 0.7–1.7)
Albumin ELP: 3.4 g/dL (ref 2.9–4.4)
Alpha 1: 0.2 g/dL (ref 0.0–0.4)
Alpha 2: 0.9 g/dL (ref 0.4–1.0)
Beta: 0.9 g/dL (ref 0.7–1.3)
Gamma Globulin: 0.8 g/dL (ref 0.4–1.8)
Globulin, Total: 2.8 g/dL (ref 2.2–3.9)
Total Protein: 6.2 g/dL (ref 6.0–8.5)

## 2019-10-08 LAB — PTH, INTACT AND CALCIUM
Calcium: 10.2 mg/dL (ref 8.7–10.2)
PTH: 59 pg/mL (ref 15–65)

## 2019-10-23 IMAGING — MG DIGITAL SCREENING BILATERAL MAMMOGRAM WITH TOMO AND CAD
8 series · 8 of 24 positions shown · non-contrast
Comparison: Previous exam(s).

CLINICAL DATA: Screening.

EXAM:
DIGITAL SCREENING BILATERAL MAMMOGRAM WITH TOMO AND CAD

[R MLO synth-2D]
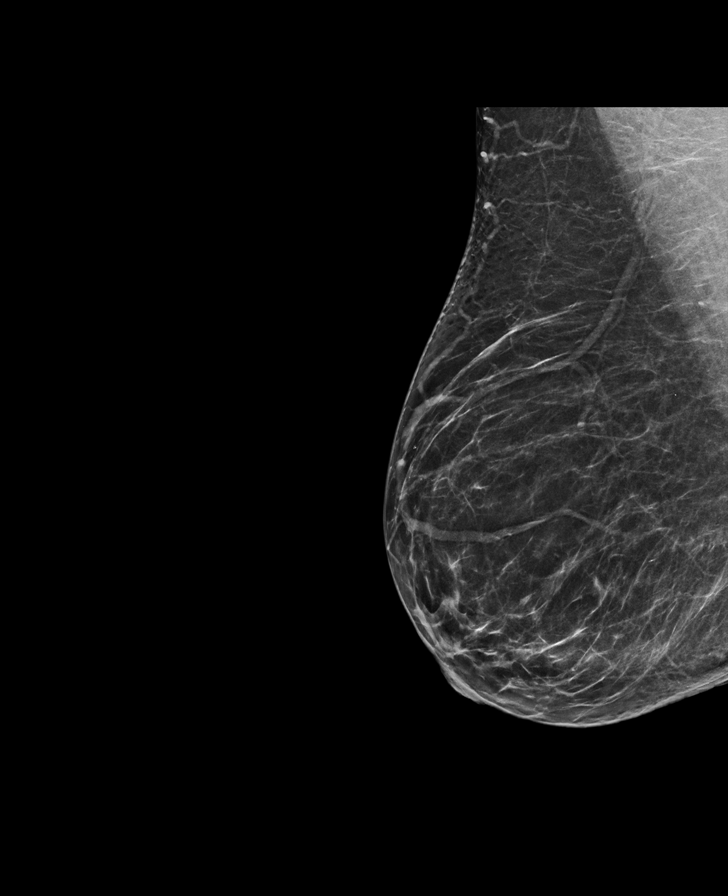

[R CC synth-2D]
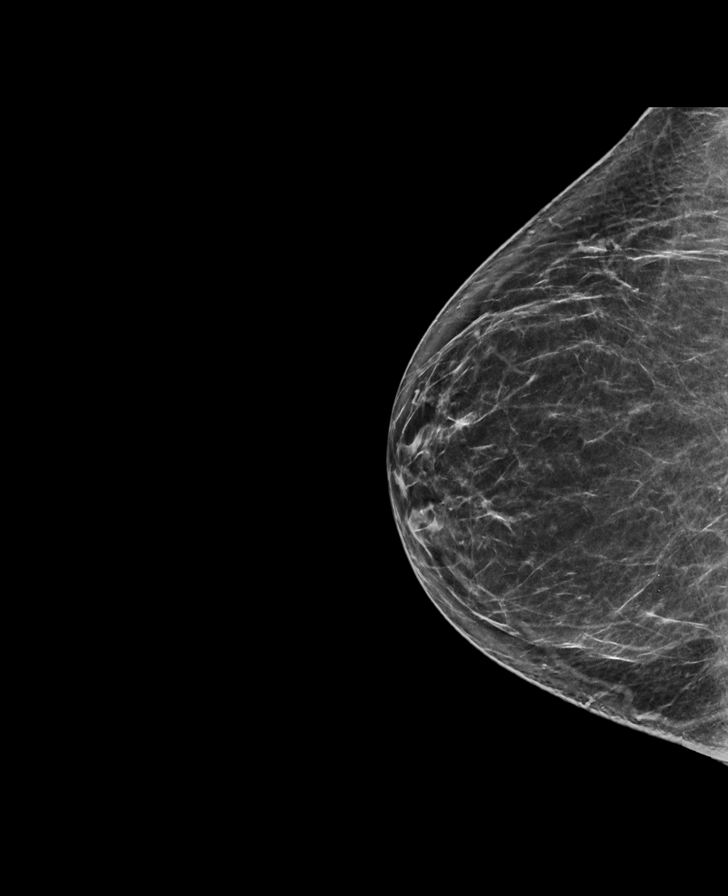

[L CC synth-2D]
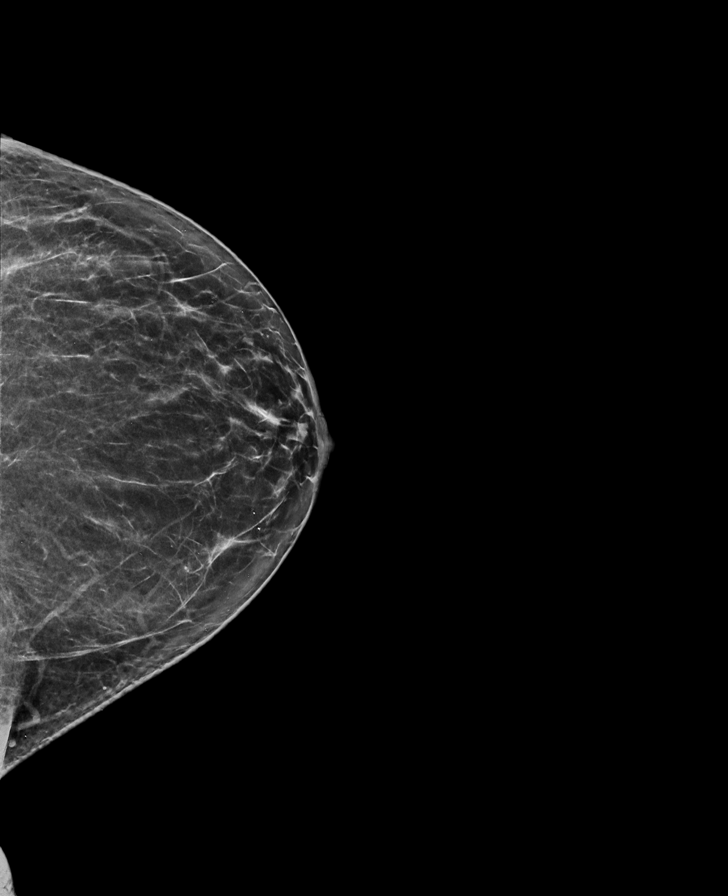

[L MLO synth-2D]
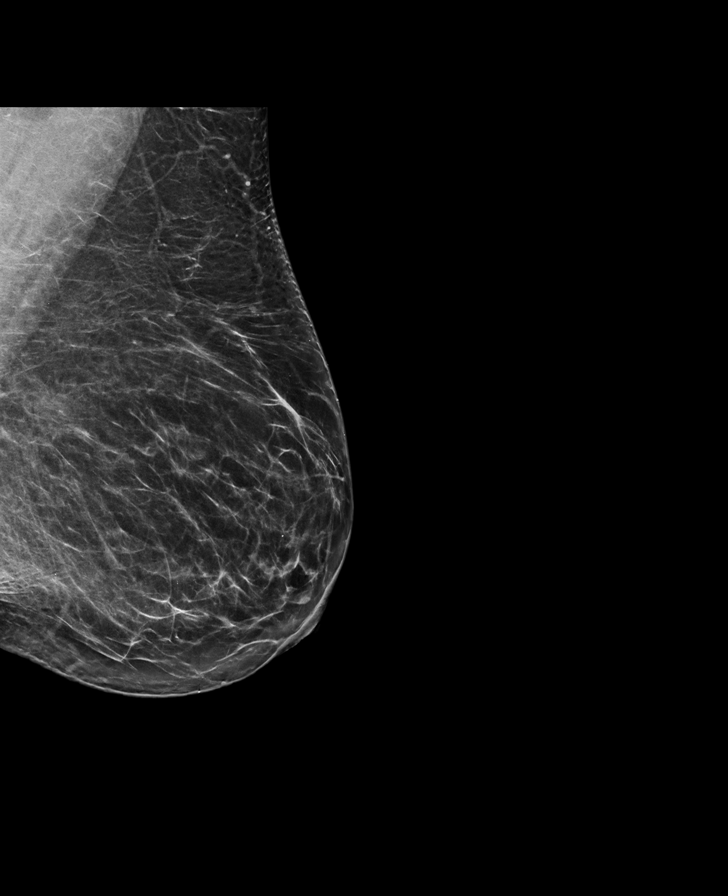

[R MLO tomo · tomo slice 37/72.0]
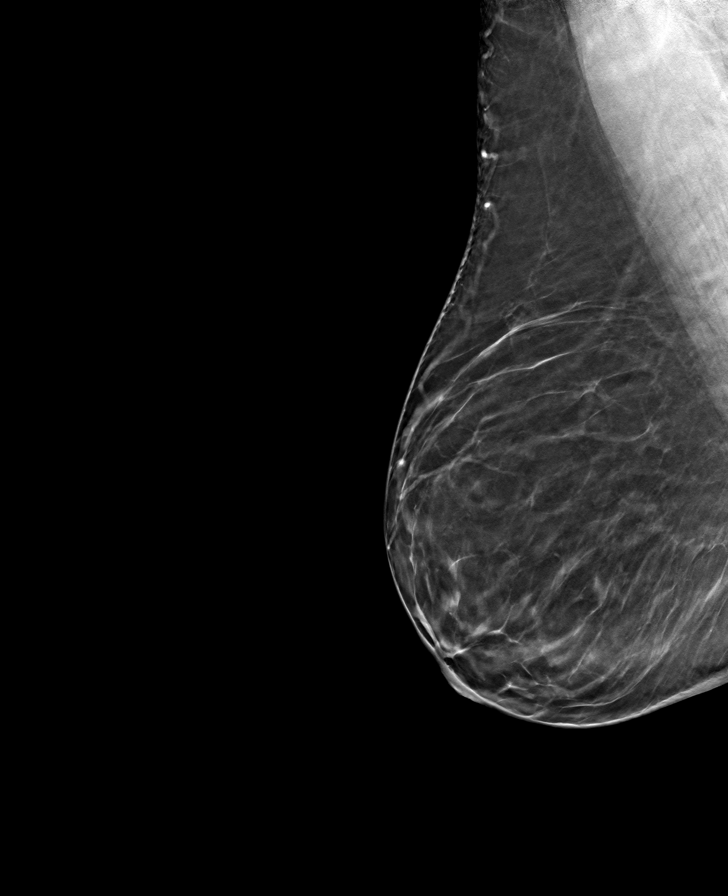

[L MLO tomo · tomo slice 40/79.0]
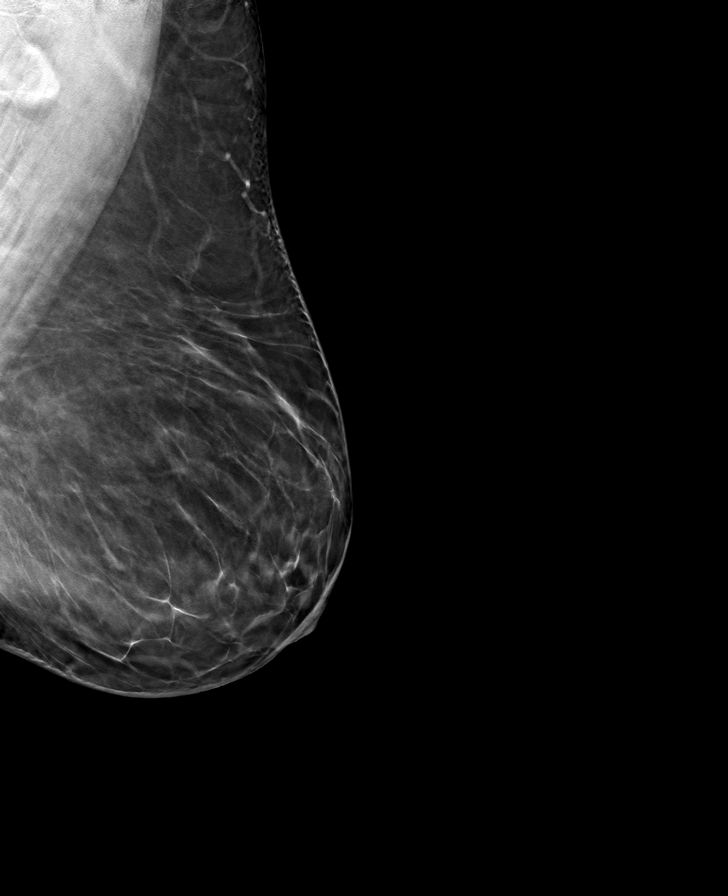

[R CC tomo · tomo slice 37/73.0]
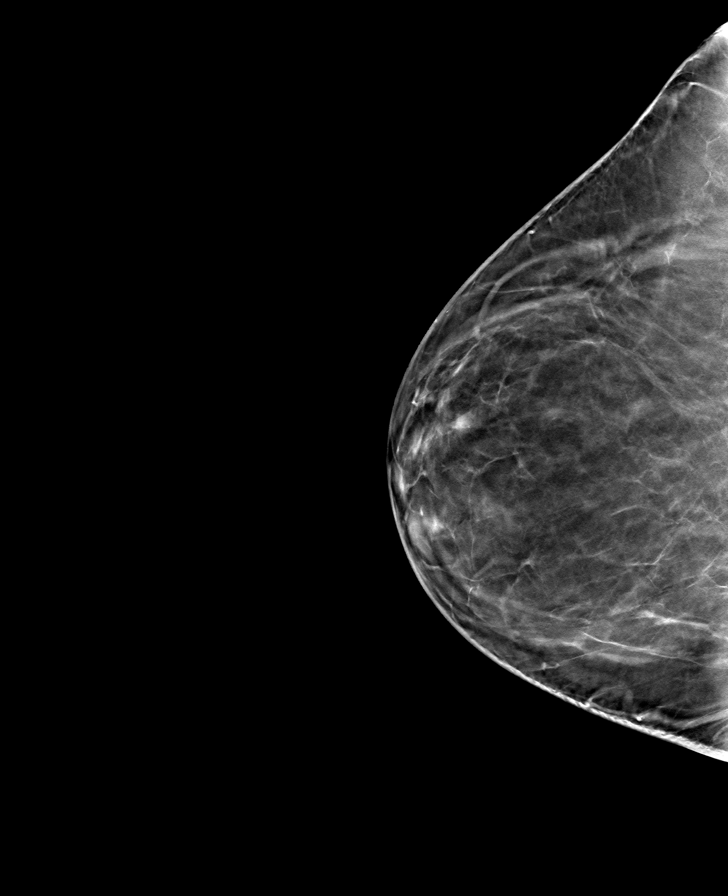

[L CC tomo · tomo slice 35/68.0]
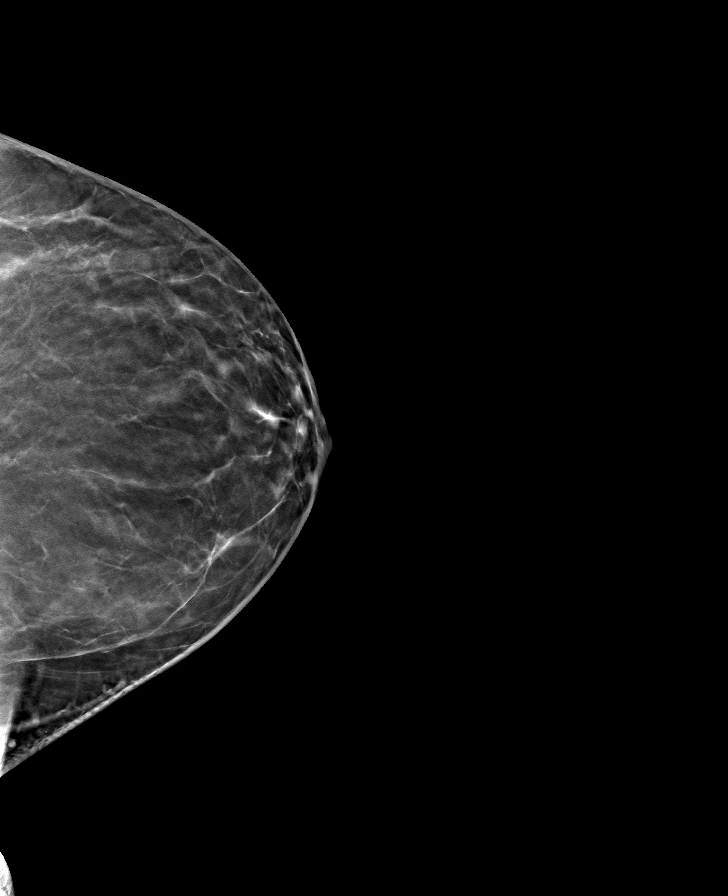

[8 of 24 positions shown; findings below may reference images not displayed]

ACR Breast Density Category b: There are scattered areas of
fibroglandular density.
FINDINGS: There are no findings suspicious for malignancy. Images were
processed with CAD.
IMPRESSION: No mammographic evidence of malignancy. A result letter of this
screening mammogram will be mailed directly to the patient.

RECOMMENDATION:
Screening mammogram in one year. (Code:[id number])

BI-RADS CATEGORY  1: Negative.

## 2019-11-20 ENCOUNTER — Other Ambulatory Visit: Payer: Self-pay | Admitting: Internal Medicine

## 2019-12-21 ENCOUNTER — Other Ambulatory Visit: Payer: Self-pay | Admitting: Internal Medicine

## 2019-12-21 DIAGNOSIS — Z1231 Encounter for screening mammogram for malignant neoplasm of breast: Secondary | ICD-10-CM

## 2019-12-26 ENCOUNTER — Encounter: Payer: Self-pay | Admitting: Internal Medicine

## 2019-12-27 ENCOUNTER — Other Ambulatory Visit: Payer: Self-pay | Admitting: Cardiology

## 2019-12-28 ENCOUNTER — Telehealth: Payer: Self-pay

## 2019-12-28 ENCOUNTER — Other Ambulatory Visit: Payer: Self-pay

## 2019-12-28 MED ORDER — ROSUVASTATIN CALCIUM 10 MG PO TABS: ORAL_TABLET | ORAL | 0 refills | Status: DC

## 2019-12-28 NOTE — Telephone Encounter (Signed)
Patient called for reill on [facility]. I told her she neeeded to f/u with her PCP. I did send in 1 ore refill so she would not run oiut

## 2020-01-14 LAB — HM DIABETES EYE EXAM

## 2020-01-29 ENCOUNTER — Other Ambulatory Visit: Payer: Self-pay

## 2020-01-29 ENCOUNTER — Ambulatory Visit
Admission: RE | Admit: 2020-01-29 | Discharge: 2020-01-29 | Disposition: A | Discharge status: Home / Self Care | Payer: Commercial Managed Care - PPO | Source: Ambulatory Visit | Attending: Internal Medicine | Admitting: Internal Medicine

## 2020-01-29 DIAGNOSIS — Z1231 Encounter for screening mammogram for malignant neoplasm of breast: Secondary | ICD-10-CM

## 2020-01-31 ENCOUNTER — Other Ambulatory Visit: Payer: Self-pay

## 2020-01-31 ENCOUNTER — Ambulatory Visit: Payer: Commercial Managed Care - PPO | Admitting: Internal Medicine

## 2020-01-31 ENCOUNTER — Encounter: Payer: Self-pay | Admitting: Internal Medicine

## 2020-01-31 VITALS — BP 122/74 | HR 60 | Temp 98.4°F | Ht 63.6 in | Wt 210.0 lb

## 2020-01-31 DIAGNOSIS — I10 Essential (primary) hypertension: Secondary | ICD-10-CM | POA: Diagnosis not present

## 2020-01-31 DIAGNOSIS — N951 Menopausal and female climacteric states: Secondary | ICD-10-CM | POA: Diagnosis not present

## 2020-01-31 DIAGNOSIS — E1165 Type 2 diabetes mellitus with hyperglycemia: Secondary | ICD-10-CM | POA: Diagnosis not present

## 2020-01-31 DIAGNOSIS — Z6836 Body mass index (BMI) 36.0-36.9, adult: Secondary | ICD-10-CM

## 2020-01-31 DIAGNOSIS — Z23 Encounter for immunization: Secondary | ICD-10-CM

## 2020-01-31 MED ORDER — ROSUVASTATIN CALCIUM 10 MG PO TABS
ORAL_TABLET | ORAL | 1 refills | Status: DC
Start: 2020-01-31 — End: 2021-01-19

## 2020-01-31 MED ORDER — ONETOUCH ULTRA VI STRP: ORAL_STRIP | 1 refills | Status: DC

## 2020-01-31 NOTE — Progress Notes (Signed)
Rutherford Nail as a scribe for Maximino Greenland, MD.,have documented all relevant documentation on the behalf of Maximino Greenland, MD,as directed by  Maximino Greenland, MD while in the presence of Maximino Greenland, MD. This visit occurred during the SARS-CoV-2 public health emergency.  Safety protocols were in place, including screening questions prior to the visit, additional usage of staff PPE, and extensive cleaning of exam room while observing appropriate contact time as indicated for disinfecting solutions.  Subjective:     Patient ID: Katelyn Schmidt , female    DOB: [birth year] , 59 y.o.   MRN: [id number]   Chief Complaint  Patient presents with  . Diabetes  . Hypertension    HPI  Pt here today for DM and BP check. She reports compliance with meds. She admits she is not exercising on a regular basis.   Diabetes She presents for her follow-up diabetic visit. She has type 2 diabetes mellitus. There are no hypoglycemic associated symptoms. Pertinent negatives for hypoglycemia include no dizziness or headaches. There are no diabetic associated symptoms. Pertinent negatives for diabetes include no fatigue, no polydipsia, no polyphagia and no polyuria. Weakness:  There are no hypoglycemic complications. Symptoms are stable. Risk factors for coronary artery disease include diabetes mellitus, dyslipidemia, hypertension, post-menopausal and sedentary lifestyle. She is compliant with treatment most of the time. She participates in exercise intermittently. Her breakfast blood glucose is taken between 7-8 am. Her breakfast blood glucose range is generally 70-90 mg/dl. Eye exam is current.  Hypertension This is a chronic problem. The current episode started more than 1 year ago. The problem has been gradually improving since onset. The problem is controlled. Pertinent negatives include no headaches. Risk factors for coronary artery disease include diabetes mellitus, obesity, post-menopausal state  and sedentary lifestyle. The current treatment provides moderate improvement. Compliance problems include exercise.      Past Medical History:  Diagnosis Date  . Diabetes mellitus, type 2 ([place])   . Dyslipidemia   . Hypertension   . Menorrhagia    fibroids  . Obesity   . Phlebitis   . Varicose veins      Family History  Problem Relation Age of Onset  . Hypertension Mother   . Hypertension Father   . Diabetes Father   . Heart failure Father   . Stroke Brother   . Heart failure Brother      Current Outpatient Medications:  .  amLODipine (NORVASC) 10 MG tablet, TAKE 1 TABLET BY MOUTH EVERY DAY, Disp: 90 tablet, Rfl: 1 .  Ascorbic Acid (VITAMIN C) 1000 MG tablet, Take by mouth daily. , Disp: , Rfl:  .  aspirin 81 MG tablet, Take 81 mg by mouth daily., Disp: , Rfl:  .  Fexofenadine HCl (ALLEGRA PO), Take 1 tablet by mouth daily., Disp: , Rfl:  .  metFORMIN (GLUCOPHAGE) 500 MG tablet, TAKE 1 TABLET BY MOUTH TWICE DAILY, Disp: 180 tablet, Rfl: 2 .  Multiple Vitamins-Minerals (MULTIVITAMIN PO), Take 1 tablet by mouth daily., Disp: , Rfl:  .  nystatin cream (MYCOSTATIN), Apply 1 application topically 2 times per day to affected area, Disp: 30 g, Rfl: 0 .  progesterone (PROMETRIUM) 100 MG capsule, Take 50 mg by mouth at bedtime. Except Sundays, Disp: , Rfl:  .  telmisartan-hydrochlorothiazide (MICARDIS HCT) 40-12.5 MG tablet, TAKE 1 TABLET BY MOUTH DAILY, Disp: 90 tablet, Rfl: 1 .  meclizine (ANTIVERT) 25 MG tablet, Take 25 mg by mouth every 6 (six) hours as  needed. (Patient not taking: Reported on 01/31/2020), Disp: , Rfl:  .  ONETOUCH ULTRA test strip, TEST BLOOD SUGAR TWICE DAILY dx: ex e11.65, Disp: 300 each, Rfl: 1 .  rosuvastatin (CRESTOR) 10 MG tablet, TAKE 1 TABLET(10 MG) BY MOUTH DAILY, Disp: 90 tablet, Rfl: 1   No Known Allergies   Review of Systems  Constitutional: Negative.  Negative for fatigue.  HENT: Negative.   Cardiovascular: Negative.   Gastrointestinal: Negative.    Endocrine: Negative for polydipsia, polyphagia and polyuria.  Musculoskeletal: Negative.   Skin: Negative.   Neurological: Negative for dizziness and headaches. Weakness:   Psychiatric/Behavioral: Negative.      Today's Vitals   01/31/20 0923  BP: 122/74  Pulse: 60  Temp: 98.4 F (36.9 C)  TempSrc: Oral  Weight: 210 lb (95.3 kg)  Height: 5' 3.6" (1.615 m)   Body mass index is 36.5 kg/m.  Wt Readings from Last 3 Encounters:  01/31/20 210 lb (95.3 kg)  09/26/19 203 lb (92.1 kg)  08/28/19 208 lb 12.8 oz (94.7 kg)   Objective:  Physical Exam Vitals and nursing note reviewed.  Constitutional:      Appearance: Normal appearance. She is obese.  HENT:     Head: Normocephalic and atraumatic.  Cardiovascular:     Rate and Rhythm: Normal rate and regular rhythm.     Heart sounds: Normal heart sounds.  Pulmonary:     Effort: Pulmonary effort is normal.     Breath sounds: Normal breath sounds.  Skin:    General: Skin is warm.  Neurological:     General: No focal deficit present.     Mental Status: She is alert.  Psychiatric:        Mood and Affect: Mood normal.        Behavior: Behavior normal.         Assessment And Plan:     1. Uncontrolled type 2 diabetes mellitus with hyperglycemia (HCC) Comments: Chronic. I will check labs as listed below. Importance of regular exercise was discussed with the patient.  - BMP8+EGFR - Hemoglobin A1c  2. Essential hypertension Comments: Chronic, well controlled. She will continue with current meds. She is encouraged to avoid adding salt to her foods.   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 Her sx have improved with use of progesterone SR $RemoveBeforeD'50mg'YxutkGqKqiTxsT$  nightly. I will call in refills into [facility].   4. Class 2 severe obesity due to excess calories with serious comorbidity and body mass index (BMI) of 36.0 to 36.9 in adult [facility]) Comments: She is encouraged to strive for BMi less than 30 to decrease cardiac risk. Advised  to aim for at least 150 minutes of exercise per week.   5. Immunization due Comments: She was given Pneumovax-23 to update her immunization history.  - Pneumococcal polysaccharide vaccine 23-valent greater than or equal to 2yo subcutaneous/IM    Patient was given opportunity to ask questions. Patient verbalized understanding of the plan and was able to repeat key elements of the plan. All questions were answered to their satisfaction.  Maximino Greenland, MD   I, Maximino Greenland, MD, have reviewed all documentation for this visit. The documentation on 02/02/20 for the exam, diagnosis, procedures, and orders are all accurate and complete.  THE PATIENT IS ENCOURAGED TO PRACTICE SOCIAL DISTANCING DUE TO THE COVID-19 PANDEMIC.

## 2020-01-31 NOTE — Patient Instructions (Signed)
Hypertension, Adult Hypertension is another name for high blood pressure. High blood pressure forces your heart to work harder to pump blood. This can cause problems over time. There are two numbers in a blood pressure reading. There is a top number (systolic) over a bottom number (diastolic). It is best to have a blood pressure that is below 120/80. Healthy choices can help lower your blood pressure, or you may need medicine to help lower it. What are the causes? The cause of this condition is not known. Some conditions may be related to high blood pressure. What increases the risk?  Smoking.  Having type 2 diabetes mellitus, high cholesterol, or both.  Not getting enough exercise or physical activity.  Being overweight.  Having too much fat, sugar, calories, or salt (sodium) in your diet.  Drinking too much alcohol.  Having long-term (chronic) kidney disease.  Having a family history of high blood pressure.  Age. Risk increases with age.  Race. You may be at higher risk if you are African American.  Gender. Men are at higher risk than women before age 45. After age 65, women are at higher risk than men.  Having obstructive sleep apnea.  Stress. What are the signs or symptoms?  High blood pressure may not cause symptoms. Very high blood pressure (hypertensive crisis) may cause: ? Headache. ? Feelings of worry or nervousness (anxiety). ? Shortness of breath. ? Nosebleed. ? A feeling of being sick to your stomach (nausea). ? Throwing up (vomiting). ? Changes in how you see. ? Very bad chest pain. ? Seizures. How is this treated?  This condition is treated by making healthy lifestyle changes, such as: ? Eating healthy foods. ? Exercising more. ? Drinking less alcohol.  Your health care provider may prescribe medicine if lifestyle changes are not enough to get your blood pressure under control, and if: ? Your top number is above 130. ? Your bottom number is above  80.  Your personal target blood pressure may vary. Follow these instructions at home: Eating and drinking   If told, follow the DASH eating plan. To follow this plan: ? Fill one half of your plate at each meal with fruits and vegetables. ? Fill one fourth of your plate at each meal with whole grains. Whole grains include whole-wheat pasta, brown rice, and whole-grain bread. ? Eat or drink low-fat dairy products, such as skim milk or low-fat yogurt. ? Fill one fourth of your plate at each meal with low-fat (lean) proteins. Low-fat proteins include fish, chicken without skin, eggs, beans, and tofu. ? Avoid fatty meat, cured and processed meat, or chicken with skin. ? Avoid pre-made or processed food.  Eat less than 1,500 mg of salt each day.  Do not drink alcohol if: ? Your doctor tells you not to drink. ? You are pregnant, may be pregnant, or are planning to become pregnant.  If you drink alcohol: ? Limit how much you use to:  0-1 drink a day for women.  0-2 drinks a day for men. ? Be aware of how much alcohol is in your drink. In the [place] one drink equals one 12 oz bottle of beer (355 mL), one 5 oz glass of wine (148 mL), or one 1 oz glass of hard liquor (44 mL). Lifestyle   Work with your doctor to stay at a healthy weight or to lose weight. Ask your doctor what the best weight is for you.  Get at least 30 minutes of exercise most   days of the week. This may include walking, swimming, or biking.  Get at least 30 minutes of exercise that strengthens your muscles (resistance exercise) at least 3 days a week. This may include lifting weights or doing Pilates.  Do not use any products that contain nicotine or tobacco, such as cigarettes, e-cigarettes, and chewing tobacco. If you need help quitting, ask your doctor.  Check your blood pressure at home as told by your doctor.  Keep all follow-up visits as told by your doctor. This is important. Medicines  Take over-the-counter  and prescription medicines only as told by your doctor. Follow directions carefully.  Do not skip doses of blood pressure medicine. The medicine does not work as well if you skip doses. Skipping doses also puts you at risk for problems.  Ask your doctor about side effects or reactions to medicines that you should watch for. Contact a doctor if you:  Think you are having a reaction to the medicine you are taking.  Have headaches that keep coming back (recurring).  Feel dizzy.  Have swelling in your ankles.  Have trouble with your vision. Get help right away if you:  Get a very bad headache.  Start to feel mixed up (confused).  Feel weak or numb.  Feel faint.  Have very bad pain in your: ? Chest. ? Belly (abdomen).  Throw up more than once.  Have trouble breathing. Summary  Hypertension is another name for high blood pressure.  High blood pressure forces your heart to work harder to pump blood.  For most people, a normal blood pressure is less than 120/80.  Making healthy choices can help lower blood pressure. If your blood pressure does not get lower with healthy choices, you may need to take medicine. This information is not intended to replace advice given to you by your health care provider. Make sure you discuss any questions you have with your health care provider. Document Revised: 10/19/2017 Document Reviewed: 10/19/2017 Elsevier Patient Education  2020 [employer] Inc[employer] Diabetes Mellitus and Exercise Exercising regularly is important for your overall health, especially when you have diabetes (diabetes mellitus). Exercising is not only about losing weight. It has many other health benefits, such as increasing muscle strength and bone density and reducing body fat and stress. This leads to improved fitness, flexibility, and endurance, all of which result in better overall health. Exercise has additional benefits for people with diabetes, including:  Reducing  appetite.  Helping to lower and control blood glucose.  Lowering blood pressure.  Helping to control amounts of fatty substances (lipids) in the blood, such as cholesterol and triglycerides.  Helping the body to respond better to insulin (improving insulin sensitivity).  Reducing how much insulin the body needs.  Decreasing the risk for heart disease by: ? Lowering cholesterol and triglyceride levels. ? Increasing the levels of good cholesterol. ? Lowering blood glucose levels. What is my activity plan? Your health care provider or certified diabetes educator can help you make a plan for the type and frequency of exercise (activity plan) that works for you. Make sure that you:  Do at least 150 minutes of moderate-intensity or vigorous-intensity exercise each week. This could be brisk walking, biking, or water aerobics. ? Do stretching and strength exercises, such as yoga or weightlifting, at least 2 times a week. ? Spread out your activity over at least 3 days of the week.  Get some form of physical activity every day. ? Do not go more than 2 days   in a row without some kind of physical activity. ? Avoid being inactive for more than 30 minutes at a time. Take frequent breaks to walk or stretch.  Choose a type of exercise or activity that you enjoy, and set realistic goals.  Start slowly, and gradually increase the intensity of your exercise over time. What do I need to know about managing my diabetes?   Check your blood glucose before and after exercising. ? If your blood glucose is 240 mg/dL (13.3 mmol/L) or higher before you exercise, check your urine for ketones. If you have ketones in your urine, do not exercise until your blood glucose returns to normal. ? If your blood glucose is 100 mg/dL (5.6 mmol/L) or lower, eat a snack containing 15-20 grams of carbohydrate. Check your blood glucose 15 minutes after the snack to make sure that your level is above 100 mg/dL (5.6 mmol/L)  before you start your exercise.  Know the symptoms of low blood glucose (hypoglycemia) and how to treat it. Your risk for hypoglycemia increases during and after exercise. Common symptoms of hypoglycemia can include: ? Hunger. ? Anxiety. ? Sweating and feeling clammy. ? Confusion. ? Dizziness or feeling light-headed. ? Increased heart rate or palpitations. ? Blurry vision. ? Tingling or numbness around the mouth, lips, or tongue. ? Tremors or shakes. ? Irritability.  Keep a rapid-acting carbohydrate snack available before, during, and after exercise to help prevent or treat hypoglycemia.  Avoid injecting insulin into areas of the body that are going to be exercised. For example, avoid injecting insulin into: ? The arms, when playing tennis. ? The legs, when jogging.  Keep records of your exercise habits. Doing this can help you and your health care provider adjust your diabetes management plan as needed. Write down: ? Food that you eat before and after you exercise. ? Blood glucose levels before and after you exercise. ? The type and amount of exercise you have done. ? When your insulin is expected to peak, if you use insulin. Avoid exercising at times when your insulin is peaking.  When you start a new exercise or activity, work with your health care provider to make sure the activity is safe for you, and to adjust your insulin, medicines, or food intake as needed.  Drink plenty of water while you exercise to prevent dehydration or heat stroke. Drink enough fluid to keep your urine clear or pale yellow. Summary  Exercising regularly is important for your overall health, especially when you have diabetes (diabetes mellitus).  Exercising has many health benefits, such as increasing muscle strength and bone density and reducing body fat and stress.  Your health care provider or certified diabetes educator can help you make a plan for the type and frequency of exercise (activity plan)  that works for you.  When you start a new exercise or activity, work with your health care provider to make sure the activity is safe for you, and to adjust your insulin, medicines, or food intake as needed. This information is not intended to replace advice given to you by your health care provider. Make sure you discuss any questions you have with your health care provider. Document Revised: 09/02/2016 Document Reviewed: 07/21/2015 Elsevier Patient Education  2020 Elsevier Inc.  

## 2020-02-09 ENCOUNTER — Other Ambulatory Visit: Payer: Self-pay | Admitting: Internal Medicine

## 2020-03-20 ENCOUNTER — Encounter: Payer: Self-pay | Admitting: Internal Medicine

## 2020-04-23 ENCOUNTER — Other Ambulatory Visit: Payer: Self-pay | Admitting: Internal Medicine

## 2020-05-11 ENCOUNTER — Other Ambulatory Visit: Payer: Self-pay | Admitting: Cardiology

## 2020-05-23 LAB — HM COLONOSCOPY

## 2020-06-02 ENCOUNTER — Ambulatory Visit: Payer: Commercial Managed Care - PPO | Admitting: Internal Medicine

## 2020-07-22 ENCOUNTER — Other Ambulatory Visit: Payer: Self-pay | Admitting: Internal Medicine

## 2020-09-25 ENCOUNTER — Ambulatory Visit (INDEPENDENT_AMBULATORY_CARE_PROVIDER_SITE_OTHER): Payer: Commercial Managed Care - PPO | Admitting: Internal Medicine

## 2020-09-25 ENCOUNTER — Encounter: Payer: Self-pay | Admitting: Internal Medicine

## 2020-09-25 ENCOUNTER — Other Ambulatory Visit: Payer: Self-pay

## 2020-09-25 VITALS — BP 124/78 | Temp 98.5°F | Ht 63.6 in | Wt 210.6 lb

## 2020-09-25 DIAGNOSIS — I10 Essential (primary) hypertension: Secondary | ICD-10-CM | POA: Diagnosis not present

## 2020-09-25 DIAGNOSIS — E559 Vitamin D deficiency, unspecified: Secondary | ICD-10-CM | POA: Insufficient documentation

## 2020-09-25 DIAGNOSIS — Z6836 Body mass index (BMI) 36.0-36.9, adult: Secondary | ICD-10-CM

## 2020-09-25 DIAGNOSIS — R001 Bradycardia, unspecified: Secondary | ICD-10-CM

## 2020-09-25 DIAGNOSIS — E1165 Type 2 diabetes mellitus with hyperglycemia: Secondary | ICD-10-CM | POA: Diagnosis not present

## 2020-09-25 DIAGNOSIS — Z0000 Encounter for general adult medical examination without abnormal findings: Secondary | ICD-10-CM | POA: Diagnosis not present

## 2020-09-25 LAB — POCT URINALYSIS DIPSTICK
Bilirubin, UA: NEGATIVE
Glucose, UA: NEGATIVE
Ketones, UA: NEGATIVE
Nitrite, UA: NEGATIVE
Protein, UA: NEGATIVE
Spec Grav, UA: 1.015 (ref 1.010–1.025)
Urobilinogen, UA: 0.2 E.U./dL
pH, UA: 7 (ref 5.0–8.0)

## 2020-09-25 LAB — POCT UA - MICROALBUMIN
Albumin/Creatinine Ratio, Urine, POC: 30
Creatinine, POC: 50 mg/dL
Microalbumin Ur, POC: 10 mg/L

## 2020-09-25 NOTE — Patient Instructions (Signed)
Health Maintenance, Female Adopting a healthy lifestyle and getting preventive care are important in promoting health and wellness. Ask your health care provider about: The right schedule for you to have regular tests and exams. Things you can do on your own to prevent diseases and keep yourself healthy. What should I know about diet, weight, and exercise? Eat a healthy diet  Eat a diet that includes plenty of vegetables, fruits, low-fat dairy products, and lean protein. Do not eat a lot of foods that are high in solid fats, added sugars, or sodium.  Maintain a healthy weight Body mass index (BMI) is used to identify weight problems. It estimates body fat based on height and weight. Your health care provider can help determineyour BMI and help you achieve or maintain a healthy weight. Get regular exercise Get regular exercise. This is one of the most important things you can do for your health. Most adults should: Exercise for at least 150 minutes each week. The exercise should increase your heart rate and make you sweat (moderate-intensity exercise). Do strengthening exercises at least twice a week. This is in addition to the moderate-intensity exercise. Spend less time sitting. Even light physical activity can be beneficial. Watch cholesterol and blood lipids Have your blood tested for lipids and cholesterol at 58 years of age, then havethis test every 5 years. Have your cholesterol levels checked more often if: Your lipid or cholesterol levels are high. You are older than 58 years of age. You are at high risk for heart disease. What should I know about cancer screening? Depending on your health history and family history, you may need to have cancer screening at various ages. This may include screening for: Breast cancer. Cervical cancer. Colorectal cancer. Skin cancer. Lung cancer. What should I know about heart disease, diabetes, and high blood pressure? Blood pressure and heart  disease High blood pressure causes heart disease and increases the risk of stroke. This is more likely to develop in people who have high blood pressure readings, are of African descent, or are overweight. Have your blood pressure checked: Every 3-5 years if you are 18-39 years of age. Every year if you are 40 years old or older. Diabetes Have regular diabetes screenings. This checks your fasting blood sugar level. Have the screening done: Once every three years after age 40 if you are at a normal weight and have a low risk for diabetes. More often and at a younger age if you are overweight or have a high risk for diabetes. What should I know about preventing infection? Hepatitis B If you have a higher risk for hepatitis B, you should be screened for this virus. Talk with your health care provider to find out if you are at risk forhepatitis B infection. Hepatitis C Testing is recommended for: Everyone born from 1945 through 1965. Anyone with known risk factors for hepatitis C. Sexually transmitted infections (STIs) Get screened for STIs, including gonorrhea and chlamydia, if: You are sexually active and are younger than 58 years of age. You are older than 58 years of age and your health care provider tells you that you are at risk for this type of infection. Your sexual activity has changed since you were last screened, and you are at increased risk for chlamydia or gonorrhea. Ask your health care provider if you are at risk. Ask your health care provider about whether you are at high risk for HIV. Your health care provider may recommend a prescription medicine to help   prevent HIV infection. If you choose to take medicine to prevent HIV, you should first get tested for HIV. You should then be tested every 3 months for as long as you are taking the medicine. Pregnancy If you are about to stop having your period (premenopausal) and you may become pregnant, seek counseling before you get  pregnant. Take 400 to 800 micrograms (mcg) of folic acid every day if you become pregnant. Ask for birth control (contraception) if you want to prevent pregnancy. Osteoporosis and menopause Osteoporosis is a disease in which the bones lose minerals and strength with aging. This can result in bone fractures. If you are 65 years old or older, or if you are at risk for osteoporosis and fractures, ask your health care provider if you should: Be screened for bone loss. Take a calcium or vitamin D supplement to lower your risk of fractures. Be given hormone replacement therapy (HRT) to treat symptoms of menopause. Follow these instructions at home: Lifestyle Do not use any products that contain nicotine or tobacco, such as cigarettes, e-cigarettes, and chewing tobacco. If you need help quitting, ask your health care provider. Do not use street drugs. Do not share needles. Ask your health care provider for help if you need support or information about quitting drugs. Alcohol use Do not drink alcohol if: Your health care provider tells you not to drink. You are pregnant, may be pregnant, or are planning to become pregnant. If you drink alcohol: Limit how much you use to 0-1 drink a day. Limit intake if you are breastfeeding. Be aware of how much alcohol is in your drink. In the [place] one drink equals one 12 oz bottle of beer (355 mL), one 5 oz glass of wine (148 mL), or one 1 oz glass of hard liquor (44 mL). General instructions Schedule regular health, dental, and eye exams. Stay current with your vaccines. Tell your health care provider if: You often feel depressed. You have ever been abused or do not feel safe at home. Summary Adopting a healthy lifestyle and getting preventive care are important in promoting health and wellness. Follow your health care provider's instructions about healthy diet, exercising, and getting tested or screened for diseases. Follow your health care provider's  instructions on monitoring your cholesterol and blood pressure. This information is not intended to replace advice given to you by your health care provider. Make sure you discuss any questions you have with your healthcare provider. Document Revised: 02/01/2018 Document Reviewed: 02/01/2018 Elsevier Patient Education  2022 Else[employer] Inc.  

## 2020-09-25 NOTE — Progress Notes (Signed)
I,Yamilka [employer] as a Education administrator for Maximino Greenland, MD.,have documented all relevant documentation on the behalf of Maximino Greenland, MD,as directed by  Maximino Greenland, MD while in the presence of Maximino Greenland, MD.  This visit occurred during the SARS-CoV-2 public health emergency.  Safety protocols were in place, including screening questions prior to the visit, additional usage of staff PPE, and extensive cleaning of exam room while observing appropriate contact time as indicated for disinfecting solutions.  Subjective:     Patient ID: Katelyn Schmidt , female    DOB: 01/12/1961 , 60 y.o.   MRN: [id number]   Chief Complaint  Patient presents with   Annual Exam   Diabetes   Hypertension    HPI  Patient here for HM. She is no longer followed by GYN. She is s/p hysterectomy.   Diabetes She presents for her follow-up diabetic visit. She has type 2 diabetes mellitus. There are no hypoglycemic associated symptoms. There are no diabetic associated symptoms. Weakness: . There are no hypoglycemic complications. Symptoms are stable. Risk factors for coronary artery disease include diabetes mellitus, dyslipidemia, hypertension, post-menopausal and sedentary lifestyle. She is compliant with treatment most of the time. She participates in exercise intermittently. An ACE inhibitor/angiotensin II receptor blocker is being taken. She does not see a podiatrist.Eye exam is current.  Hypertension This is a chronic problem. The current episode started more than 1 year ago. The problem has been gradually improving since onset. The problem is controlled.    Past Medical History:  Diagnosis Date   Diabetes mellitus, type 2 ([place])    Dyslipidemia    Hypertension    Menorrhagia    fibroids   Obesity    Phlebitis    Varicose veins      Family History  Problem Relation Age of Onset   Hypertension Mother    Hypertension Father    Diabetes Father    Heart failure Father    Stroke Brother     Heart failure Brother      Current Outpatient Medications:    amLODipine (NORVASC) 10 MG tablet, TAKE 1 TABLET BY MOUTH EVERY DAY, Disp: 90 tablet, Rfl: 1   Ascorbic Acid (VITAMIN C) 1000 MG tablet, Take by mouth daily. , Disp: , Rfl:    aspirin 81 MG tablet, Take 81 mg by mouth daily., Disp: , Rfl:    BIOTIN PO, Take 2,000 mg by mouth daily at 6 (six) AM., Disp: , Rfl:    Fexofenadine HCl (ALLEGRA PO), Take 1 tablet by mouth daily., Disp: , Rfl:    meclizine (ANTIVERT) 25 MG tablet, Take 25 mg by mouth every 6 (six) hours as needed., Disp: , Rfl:    metFORMIN (GLUCOPHAGE) 500 MG tablet, TAKE 1 TABLET BY MOUTH TWICE DAILY, Disp: 180 tablet, Rfl: 2   Multiple Vitamins-Minerals (MULTIVITAMIN PO), Take 1 tablet by mouth daily., Disp: , Rfl:    nystatin cream (MYCOSTATIN), Apply 1 application topically 2 times per day to affected area, Disp: 30 g, Rfl: 0   ONETOUCH ULTRA test strip, USE TO TEST SUGAR TWICE DAILY, Disp: 300 strip, Rfl: 2   progesterone (PROMETRIUM) 100 MG capsule, Take 50 mg by mouth at bedtime. Except Sundays, Disp: , Rfl:    rosuvastatin (CRESTOR) 10 MG tablet, TAKE 1 TABLET(10 MG) BY MOUTH DAILY, Disp: 90 tablet, Rfl: 1   telmisartan-hydrochlorothiazide (MICARDIS HCT) 40-12.5 MG tablet, TAKE 1 TABLET BY MOUTH DAILY, Disp: 90 tablet, Rfl: 1   No  Known Allergies    The patient states she uses post menopausal status for birth control. Last LMP was No LMP recorded. Patient is postmenopausal.. Negative for Dysmenorrhea. Negative for: breast discharge, breast lump(s), breast pain and breast self exam. Associated symptoms include abnormal vaginal bleeding. Pertinent negatives include abnormal bleeding (hematology), anxiety, decreased libido, depression, difficulty falling sleep, dyspareunia, history of infertility, nocturia, sexual dysfunction, sleep disturbances, urinary incontinence, urinary urgency, vaginal discharge and vaginal itching. Diet regular.The patient states her  exercise level is  intermittent.  . The patient's tobacco use is:  Social History   Tobacco Use  Smoking Status Never  Smokeless Tobacco Never  . She has been exposed to passive smoke. The patient's alcohol use is:  Social History   Substance and Sexual Activity  Alcohol Use Yes   Comment: occas 1x mo    Review of Systems  Constitutional: Negative.   HENT: Negative.    Eyes: Negative.   Respiratory: Negative.    Cardiovascular: Negative.   Gastrointestinal: Negative.   Endocrine: Negative.   Genitourinary: Negative.   Musculoskeletal: Negative.   Skin: Negative.   Allergic/Immunologic: Negative.   Neurological: Negative.  Weakness: .  Hematological: Negative.   Psychiatric/Behavioral: Negative.      Today's Vitals   09/25/20 1036  BP: 124/78  Temp: 98.5 F (36.9 C)  Weight: 210 lb 9.6 oz (95.5 kg)  Height: 5' 3.6" (1.615 m)  PainSc: 0-No pain   Body mass index is 36.61 kg/m.  Wt Readings from Last 3 Encounters:  09/25/20 210 lb 9.6 oz (95.5 kg)  01/31/20 210 lb (95.3 kg)  09/26/19 203 lb (92.1 kg)     Objective:  Physical Exam Vitals and nursing note reviewed.  Constitutional:      Appearance: Normal appearance. She is obese.  HENT:     Head: Normocephalic and atraumatic.     Right Ear: Tympanic membrane, ear canal and external ear normal.     Left Ear: Tympanic membrane, ear canal and external ear normal.     Nose:     Comments: Masked     Mouth/Throat:     Comments: Masked  Eyes:     Extraocular Movements: Extraocular movements intact.     Conjunctiva/sclera: Conjunctivae normal.     Pupils: Pupils are equal, round, and reactive to light.  Cardiovascular:     Rate and Rhythm: Normal rate and regular rhythm.     Pulses: Normal pulses.          Dorsalis pedis pulses are 2+ on the right side and 2+ on the left side.     Heart sounds: Normal heart sounds.  Pulmonary:     Effort: Pulmonary effort is normal.     Breath sounds: Normal breath sounds.   Chest:  Breasts:    Tanner Score is 5.     Right: Normal.     Left: Normal.  Abdominal:     General: Abdomen is flat. Bowel sounds are normal.     Palpations: Abdomen is soft.  Genitourinary:    Comments: deferred Musculoskeletal:        General: Normal range of motion.     Cervical back: Normal range of motion and neck supple.  Feet:     Right foot:     Protective Sensation: 5 sites tested.  5 sites sensed.     Skin integrity: Dry skin present.     Toenail Condition: Right toenails are normal.     Left foot:  Protective Sensation: 5 sites tested.  5 sites sensed.     Skin integrity: Dry skin present.     Toenail Condition: Left toenails are normal.  Skin:    General: Skin is warm and dry.  Neurological:     General: No focal deficit present.     Mental Status: She is alert and oriented to person, place, and time.  Psychiatric:        Mood and Affect: Mood normal.        Behavior: Behavior normal.        Assessment And Plan:     1. Encounter for general adult medical examination w/o abnormal findings Comments: A full exam was performed. Importance of monthly self breast exams was discussed with the patient. PATIENT IS ADVISED TO GET 30-45 MINUTES REGULAR EXERCISE NO LESS THAN FOUR TO FIVE DAYS PER WEEK - BOTH WEIGHTBEARING EXERCISES AND AEROBIC ARE RECOMMENDED.  PATIENT IS ADVISED TO FOLLOW A HEALTHY DIET WITH AT LEAST SIX FRUITS/VEGGIES PER DAY, DECREASE INTAKE OF RED MEAT, AND TO INCREASE FISH INTAKE TO TWO DAYS PER WEEK.  MEATS/FISH SHOULD NOT BE FRIED, BAKED OR BROILED IS PREFERABLE.  IT IS ALSO IMPORTANT TO CUT BACK ON YOUR SUGAR INTAKE. PLEASE AVOID ANYTHING WITH ADDED SUGAR, CORN SYRUP OR OTHER SWEETENERS. IF YOU MUST USE A SWEETENER, YOU CAN TRY STEVIA. IT IS ALSO IMPORTANT TO AVOID ARTIFICIALLY SWEETENERS AND DIET BEVERAGES. LASTLY, I SUGGEST WEARING SPF 50 SUNSCREEN ON EXPOSED PARTS AND ESPECIALLY WHEN IN THE DIRECT SUNLIGHT FOR AN EXTENDED PERIOD OF TIME.  PLEASE  AVOID FAST FOOD RESTAURANTS AND INCREASE YOUR WATER INTAKE.  - CMP14+EGFR - CBC - Hemoglobin A1c - Lipid panel  2. Uncontrolled type 2 diabetes mellitus with hyperglycemia ([place]) Comments: Diabetic foot exam was performed. She will f/u in 4 months for re-evaluation. I DISCUSSED WITH THE PATIENT AT LENGTH REGARDING THE GOALS OF GLYCEMIC CONTROL AND POSSIBLE LONG-TERM COMPLICATIONS.  I  ALSO STRESSED THE IMPORTANCE OF COMPLIANCE WITH HOME GLUCOSE MONITORING, DIETARY RESTRICTIONS INCLUDING AVOIDANCE OF SUGARY DRINKS/PROCESSED FOODS,  ALONG WITH REGULAR EXERCISE.  I  ALSO STRESSED THE IMPORTANCE OF ANNUAL EYE EXAMS, SELF FOOT CARE AND COMPLIANCE WITH OFFICE VISITS.  - POCT Urinalysis Dipstick (81002) - POCT UA - Microalbumin - BIOTIN PO; Take 2,000 mg by mouth daily at 6 (six) AM.  3. Essential hypertension Comments: Chronic, well controlled. EKG performed, Marked SB w/ voltage criteria for LVH. Encouraged to follow low sodium diet. NO med changes today. - EKG 12-Lead  4. Sinus bradycardia Comments: HR 45 on EKG. She denies cp, sob, worsening fatigue. I will check thyroid function today.   5. Class 2 severe obesity due to excess calories with serious comorbidity and body mass index (BMI) of 36.0 to 36.9 in adult [facility]) She is encouraged to strive for BMI less than 30 to decrease cardiac risk. Advised to aim for at least 150 minutes of exercise per week.   Patient was given opportunity to ask questions. Patient verbalized understanding of the plan and was able to repeat key elements of the plan. All questions were answered to their satisfaction.   I, Maximino Greenland, MD, have reviewed all documentation for this visit. The documentation on 09/25/20 for the exam, diagnosis, procedures, and orders are all accurate and complete.  THE PATIENT IS ENCOURAGED TO PRACTICE SOCIAL DISTANCING DUE TO THE COVID-19 PANDEMIC.

## 2020-09-26 LAB — CMP14+EGFR
ALT: 14 IU/L (ref 0–32)
AST: 14 IU/L (ref 0–40)
Albumin/Globulin Ratio: 1.9 (ref 1.2–2.2)
Albumin: 4.3 g/dL (ref 3.8–4.9)
Alkaline Phosphatase: 86 IU/L (ref 44–121)
BUN/Creatinine Ratio: 13 (ref 9–23)
BUN: 11 mg/dL (ref 6–24)
Bilirubin Total: 0.4 mg/dL (ref 0.0–1.2)
CO2: 24 mmol/L (ref 20–29)
Calcium: 10.7 mg/dL — ABNORMAL HIGH (ref 8.7–10.2)
Chloride: 106 mmol/L (ref 96–106)
Creatinine, Ser: 0.88 mg/dL (ref 0.57–1.00)
Globulin, Total: 2.3 g/dL (ref 1.5–4.5)
Glucose: 83 mg/dL (ref 65–99)
Potassium: 4.5 mmol/L (ref 3.5–5.2)
Sodium: 144 mmol/L (ref 134–144)
Total Protein: 6.6 g/dL (ref 6.0–8.5)
eGFR: 76 mL/min/{1.73_m2} (ref >59)

## 2020-09-26 LAB — CBC
Hematocrit: 44.9 % (ref 34.0–46.6)
Hemoglobin: 13.7 g/dL (ref 11.1–15.9)
MCH: 25 pg — ABNORMAL LOW (ref 26.6–33.0)
MCHC: 30.5 g/dL — ABNORMAL LOW (ref 31.5–35.7)
MCV: 82 fL (ref 79–97)
Platelets: 304 10*3/uL (ref 150–450)
RBC: 5.48 x10E6/uL — ABNORMAL HIGH (ref 3.77–5.28)
RDW: 14.6 % (ref 11.7–15.4)
WBC: 5.5 10*3/uL (ref 3.4–10.8)

## 2020-09-26 LAB — LIPID PANEL
Chol/HDL Ratio: 2.7 ratio (ref 0.0–4.4)
Cholesterol, Total: 125 mg/dL (ref 100–199)
HDL: 47 mg/dL (ref >39)
LDL Chol Calc (NIH): 65 mg/dL (ref 0–99)
Triglycerides: 62 mg/dL (ref 0–149)
VLDL Cholesterol Cal: 13 mg/dL (ref 5–40)

## 2020-09-26 LAB — HEMOGLOBIN A1C
Est. average glucose Bld gHb Est-mCnc: 134 mg/dL
Hgb A1c MFr Bld: 6.3 % — ABNORMAL HIGH (ref 4.8–5.6)

## 2020-10-02 LAB — SPECIMEN STATUS REPORT

## 2020-10-02 LAB — TSH: TSH: 1.76 u[IU]/mL (ref 0.450–4.500)

## 2020-12-01 ENCOUNTER — Other Ambulatory Visit: Payer: Self-pay | Admitting: Internal Medicine

## 2020-12-30 ENCOUNTER — Other Ambulatory Visit: Payer: Self-pay | Admitting: Internal Medicine

## 2020-12-30 DIAGNOSIS — Z1231 Encounter for screening mammogram for malignant neoplasm of breast: Secondary | ICD-10-CM

## 2021-01-19 ENCOUNTER — Other Ambulatory Visit: Payer: Self-pay | Admitting: Internal Medicine

## 2021-01-29 ENCOUNTER — Ambulatory Visit: Payer: Commercial Managed Care - PPO | Admitting: Internal Medicine

## 2021-01-29 ENCOUNTER — Encounter: Payer: Self-pay | Admitting: Internal Medicine

## 2021-01-29 ENCOUNTER — Other Ambulatory Visit: Payer: Self-pay

## 2021-01-29 VITALS — BP 118/74 | HR 58 | Temp 99.2°F | Ht 63.6 in | Wt 212.2 lb

## 2021-01-29 DIAGNOSIS — I10 Essential (primary) hypertension: Secondary | ICD-10-CM | POA: Diagnosis not present

## 2021-01-29 DIAGNOSIS — Z2821 Immunization not carried out because of patient refusal: Secondary | ICD-10-CM

## 2021-01-29 DIAGNOSIS — E1165 Type 2 diabetes mellitus with hyperglycemia: Secondary | ICD-10-CM

## 2021-01-29 DIAGNOSIS — Z6836 Body mass index (BMI) 36.0-36.9, adult: Secondary | ICD-10-CM

## 2021-01-29 NOTE — Patient Instructions (Signed)

## 2021-01-29 NOTE — Progress Notes (Signed)
I,[employer]ataw[employer]a [employer],acting as a Education administrator for Maximino Greenland, MD.,have documented all relevant documentation on the behalf of Maximino Greenland, MD,as directed by  Maximino Greenland, MD while in the presence of Maximino Greenland, MD.  This visit occurred during the SARS-CoV-2 public health emergency.  Safety protocols were in place, including screening questions prior to the visit, additional usage of staff PPE, and extensive cleaning of exam room while observing appropriate contact time as indicated for disinfecting solutions.  Subjective:     Patient ID: Katelyn Schmidt , female    DOB: 1960-05-12 , 60 y.o.   MRN: [id number]   Chief Complaint  Patient presents with   Diabetes   Hypertension    HPI  Pt here today for DM and BP check. She reports compliance with meds. She denies headaches, chest pain and shortness of breath.   Diabetes She presents for her follow-up diabetic visit. She has type 2 diabetes mellitus. There are no hypoglycemic associated symptoms. Pertinent negatives for hypoglycemia include no dizziness or headaches. There are no diabetic associated symptoms. Pertinent negatives for diabetes include no fatigue, no polydipsia, no polyphagia and no polyuria. Weakness: .There are no hypoglycemic complications. Symptoms are stable. Risk factors for coronary artery disease include diabetes mellitus, dyslipidemia, hypertension, post-menopausal and sedentary lifestyle. She is compliant with treatment most of the time. She participates in exercise intermittently. Her breakfast blood glucose is taken between 7-8 am. Her breakfast blood glucose range is generally 70-90 mg/dl. Eye exam is current.  Hypertension This is a chronic problem. The current episode started more than 1 year ago. The problem has been gradually improving since onset. The problem is controlled. Pertinent negatives include no headaches. Risk factors for coronary artery disease include diabetes mellitus, obesity, post-menopausal  state and sedentary lifestyle. The current treatment provides moderate improvement. Compliance problems include exercise.     Past Medical History:  Diagnosis Date   Diabetes mellitus, type 2 ([place])    Dyslipidemia    Hypertension    Menorrhagia    fibroids   Obesity    Phlebitis    Varicose veins      Family History  Problem Relation Age of Onset   Hypertension Mother    Hypertension Father    Diabetes Father    Heart failure Father    Stroke Brother    Heart failure Brother      Current Outpatient Medications:    amLODipine (NORVASC) 10 MG tablet, TAKE 1 TABLET BY MOUTH EVERY DAY, Disp: 90 tablet, Rfl: 1   Ascorbic Acid (VITAMIN C) 1000 MG tablet, Take by mouth daily. , Disp: , Rfl:    aspirin 81 MG tablet, Take 81 mg by mouth daily., Disp: , Rfl:    B Complex Vitamins (B COMPLEX PO), Take by mouth., Disp: , Rfl:    BIOTIN PO, Take 2,000 mg by mouth daily at 6 (six) AM., Disp: , Rfl:    Fexofenadine HCl (ALLEGRA PO), Take 1 tablet by mouth daily., Disp: , Rfl:    metFORMIN (GLUCOPHAGE) 500 MG tablet, TAKE 1 TABLET BY MOUTH TWICE DAILY, Disp: 180 tablet, Rfl: 2   Multiple Vitamins-Minerals (MULTIVITAMIN PO), Take 1 tablet by mouth daily., Disp: , Rfl:    nystatin cream (MYCOSTATIN), Apply 1 application topically 2 times per day to affected area, Disp: 30 g, Rfl: 0   ONETOUCH ULTRA test strip, USE TO TEST SUGAR TWICE DAILY, Disp: 300 strip, Rfl: 2   progesterone (PROMETRIUM) 100 MG capsule, Take 50  mg by mouth at bedtime. Except Sundays, Disp: , Rfl:    rosuvastatin (CRESTOR) 10 MG tablet, TAKE 1 TABLET(10 MG) BY MOUTH DAILY, Disp: 90 tablet, Rfl: 1   telmisartan-hydrochlorothiazide (MICARDIS HCT) 40-12.5 MG tablet, TAKE 1 TABLET BY MOUTH DAILY, Disp: 90 tablet, Rfl: 1   No Known Allergies   Review of Systems  Constitutional: Negative.  Negative for fatigue.  Respiratory: Negative.    Cardiovascular: Negative.   Gastrointestinal: Negative.   Endocrine: Negative for  polydipsia, polyphagia and polyuria.  Neurological: Negative.  Negative for dizziness and headaches. Weakness: . Psychiatric/Behavioral: Negative.      Today's Vitals   01/29/21 0931  BP: 118/74  Pulse: (!) 58  Temp: 99.2 F (37.3 C)  Weight: 212 lb 3.2 oz (96.3 kg)  Height: 5' 3.6" (1.615 m)   Body mass index is 36.88 kg/m.  Wt Readings from Last 3 Encounters:  01/29/21 212 lb 3.2 oz (96.3 kg)  09/25/20 210 lb 9.6 oz (95.5 kg)  01/31/20 210 lb (95.3 kg)    BP Readings from Last 3 Encounters:  01/29/21 118/74  09/25/20 124/78  01/31/20 122/74    Objective:  Physical Exam Vitals and nursing note reviewed.  Constitutional:      Appearance: Normal appearance. She is obese.  HENT:     Head: Normocephalic and atraumatic.     Nose:     Comments: Masked     Mouth/Throat:     Comments: Masked  Eyes:     Extraocular Movements: Extraocular movements intact.  Cardiovascular:     Rate and Rhythm: Normal rate and regular rhythm.     Heart sounds: Normal heart sounds.  Pulmonary:     Effort: Pulmonary effort is normal.     Breath sounds: Normal breath sounds.  Musculoskeletal:     Cervical back: Normal range of motion.  Skin:    General: Skin is warm.  Neurological:     General: No focal deficit present.     Mental Status: She is alert.  Psychiatric:        Mood and Affect: Mood normal.        Behavior: Behavior normal.        Assessment And Plan:     1. Uncontrolled type 2 diabetes mellitus with hyperglycemia ([facility]) Comments: Chronic, unfortunately she had to reschedule her eye appt. It is scheduled for Jan 2023. I will check labs as below, she will f/u in 4 months.  - Hemoglobin A1c - CMP14+EGFR  2. Essential hypertension Comments: Chronic, well controlled. Encouraged to follow low sodium diet. No med changes today.   3. Hypercalcemia Comments: I will check labs as listed below. Encouraged to stay well hydrated. She is not taking any calcium supplements at this  time.  - Parathyroid Hormone, Intact w/Ca  4. Class 2 severe obesity due to excess calories with serious comorbidity and body mass index (BMI) of 36.0 to 36.9 in adult [facility]) Comments: She is encouraged to strive for BMI less than 30 to decrease cardiac risk. Advised to aim for at least 150 minutes of exercise per week.   5. Influenza vaccination declined  Patient was given opportunity to ask questions. Patient verbalized understanding of the plan and was able to repeat key elements of the plan. All questions were answered to their satisfaction.   I, Gwynneth Aliment, MD, have reviewed all documentation for this visit. The documentation on 01/29/21 for the exam, diagnosis, procedures, and orders are all accurate and complete.  IF YOU HAVE BEEN REFERRED TO A SPECIALIST, IT MAY TAKE 1-2 WEEKS TO SCHEDULE/PROCESS THE REFERRAL. IF YOU HAVE NOT HEARD FROM US/SPECIALIST IN TWO WEEKS, PLEASE GIVE [place] A CALL AT [phone].   THE PATIENT IS ENCOURAGED TO PRACTICE SOCIAL DISTANCING DUE TO THE COVID-19 PANDEMIC.

## 2021-01-30 LAB — CMP14+EGFR
ALT: 19 IU/L (ref 0–32)
AST: 16 IU/L (ref 0–40)
Albumin/Globulin Ratio: 1.8 (ref 1.2–2.2)
Albumin: 4.4 g/dL (ref 3.8–4.9)
Alkaline Phosphatase: 102 IU/L (ref 44–121)
BUN/Creatinine Ratio: 11 (ref 9–23)
BUN: 9 mg/dL (ref 6–24)
Bilirubin Total: 0.4 mg/dL (ref 0.0–1.2)
CO2: 24 mmol/L (ref 20–29)
Calcium: 10.8 mg/dL — ABNORMAL HIGH (ref 8.7–10.2)
Chloride: 103 mmol/L (ref 96–106)
Creatinine, Ser: 0.82 mg/dL (ref 0.57–1.00)
Globulin, Total: 2.4 g/dL (ref 1.5–4.5)
Glucose: 102 mg/dL — ABNORMAL HIGH (ref 70–99)
Potassium: 4.1 mmol/L (ref 3.5–5.2)
Sodium: 142 mmol/L (ref 134–144)
Total Protein: 6.8 g/dL (ref 6.0–8.5)
eGFR: 83 mL/min/1.73

## 2021-01-30 LAB — PTH, INTACT AND CALCIUM: PTH: 51 pg/mL (ref 15–65)

## 2021-01-30 LAB — HEMOGLOBIN A1C
Est. average glucose Bld gHb Est-mCnc: 134 mg/dL
Hgb A1c MFr Bld: 6.3 % — ABNORMAL HIGH (ref 4.8–5.6)

## 2021-02-04 ENCOUNTER — Ambulatory Visit
Admission: RE | Admit: 2021-02-04 | Discharge: 2021-02-04 | Disposition: A | Discharge status: Home / Self Care | Payer: Commercial Managed Care - PPO | Source: Ambulatory Visit | Attending: Internal Medicine | Admitting: Internal Medicine

## 2021-02-04 DIAGNOSIS — Z1231 Encounter for screening mammogram for malignant neoplasm of breast: Secondary | ICD-10-CM

## 2021-02-06 LAB — PROTEIN ELECTROPHORESIS
A/G Ratio: 1.2 (ref 0.7–1.7)
Albumin ELP: 3.7 g/dL (ref 2.9–4.4)
Alpha 1: 0.2 g/dL (ref 0.0–0.4)
Alpha 2: 1 g/dL (ref 0.4–1.0)
Beta: 1.1 g/dL (ref 0.7–1.3)
Gamma Globulin: 0.9 g/dL (ref 0.4–1.8)
Globulin, Total: 3.2 g/dL (ref 2.2–3.9)
Total Protein: 6.9 g/dL (ref 6.0–8.5)

## 2021-02-06 LAB — SPECIMEN STATUS REPORT

## 2021-03-01 ENCOUNTER — Other Ambulatory Visit: Payer: Self-pay | Admitting: Internal Medicine

## 2021-04-06 LAB — HM DIABETES EYE EXAM

## 2021-04-13 ENCOUNTER — Encounter: Payer: Self-pay | Admitting: Internal Medicine

## 2021-05-05 ENCOUNTER — Other Ambulatory Visit: Payer: Self-pay | Admitting: Internal Medicine

## 2021-05-05 ENCOUNTER — Other Ambulatory Visit: Payer: Self-pay

## 2021-05-05 ENCOUNTER — Ambulatory Visit
Admission: RE | Admit: 2021-05-05 | Discharge: 2021-05-05 | Disposition: A | Discharge status: Home / Self Care | Payer: Commercial Managed Care - PPO | Source: Ambulatory Visit | Attending: Internal Medicine | Admitting: Internal Medicine

## 2021-05-05 ENCOUNTER — Ambulatory Visit: Payer: Commercial Managed Care - PPO | Admitting: Internal Medicine

## 2021-05-05 ENCOUNTER — Encounter: Payer: Self-pay | Admitting: Internal Medicine

## 2021-05-05 VITALS — BP 114/72 | HR 58 | Temp 98.1°F | Ht 63.6 in | Wt 210.6 lb

## 2021-05-05 DIAGNOSIS — F4321 Adjustment disorder with depressed mood: Secondary | ICD-10-CM | POA: Diagnosis not present

## 2021-05-05 DIAGNOSIS — E1165 Type 2 diabetes mellitus with hyperglycemia: Secondary | ICD-10-CM | POA: Diagnosis not present

## 2021-05-05 DIAGNOSIS — R059 Cough, unspecified: Secondary | ICD-10-CM

## 2021-05-05 DIAGNOSIS — R051 Acute cough: Secondary | ICD-10-CM | POA: Diagnosis not present

## 2021-05-05 DIAGNOSIS — I10 Essential (primary) hypertension: Secondary | ICD-10-CM | POA: Diagnosis not present

## 2021-05-05 DIAGNOSIS — Z6836 Body mass index (BMI) 36.0-36.9, adult: Secondary | ICD-10-CM

## 2021-05-05 MED ORDER — PROMETHAZINE-DM 6.25-15 MG/5ML PO SYRP: 5.0000 mL | ORAL_SOLUTION | Freq: Three times a day (TID) | ORAL | 0 refills | Status: DC | PRN

## 2021-05-05 MED ORDER — TRIAMCINOLONE ACETONIDE 40 MG/ML IJ SUSP
40.0000 mg | Freq: Once | INTRAMUSCULAR | Status: AC
  Administered 2021-05-05: 40 mg via INTRAMUSCULAR

## 2021-05-05 NOTE — Patient Instructions (Signed)

## 2021-05-05 NOTE — Progress Notes (Signed)
?[employer] as a Education administrator for Maximino Greenland, MD.,have documented all relevant documentation on the behalf of Maximino Greenland, MD,as directed by  Maximino Greenland, MD while in the presence of Maximino Greenland, MD.  ?This visit occurred during the SARS-CoV-2 public health emergency.  Safety protocols were in place, including screening questions prior to the visit, additional usage of staff PPE, and extensive cleaning of exam room while observing appropriate contact time as indicated for disinfecting solutions. ? ?Subjective:  ?  ? Patient ID: Katelyn Schmidt , female    DOB: August 25, 1960 , 61 y.o.   MRN: [id number] ? ? ?Chief Complaint  ?Patient presents with  ? Cough  ? ? ?HPI ? ?The patient is here for evaluation of a cough.  The patient states that she was seen at [facility] last month for similar sx and was treated with Amoxicillin, nasal spray and cough syrup. She states her sx did initially go away, but they have since recurred.  ? ?Cough ?This is a recurrent problem. The current episode started 1 to 4 weeks ago. The problem has been unchanged. The cough is Non-productive. Associated symptoms include shortness of breath.   ? ?Past Medical History:  ?Diagnosis Date  ? Diabetes mellitus, type 2 ([place])   ? Dyslipidemia   ? Hypertension   ? Menorrhagia   ? fibroids  ? Obesity   ? Phlebitis   ? Varicose veins   ?  ? ?Family History  ?Problem Relation Age of Onset  ? Hypertension Mother   ? Hypertension Father   ? Diabetes Father   ? Heart failure Father   ? Stroke Brother   ? Heart failure Brother   ? ? ? ?Current Outpatient Medications:  ?  amLODipine (NORVASC) 10 MG tablet, TAKE 1 TABLET BY MOUTH EVERY DAY, Disp: 90 tablet, Rfl: 1 ?  Ascorbic Acid (VITAMIN C) 1000 MG tablet, Take by mouth daily. , Disp: , Rfl:  ?  aspirin 81 MG tablet, Take 81 mg by mouth daily., Disp: , Rfl:  ?  B Complex Vitamins (B COMPLEX PO), Take by mouth., Disp: , Rfl:  ?  Multiple Vitamins-Minerals (MULTIVITAMIN PO), Take 1  tablet by mouth daily., Disp: , Rfl:  ?  nystatin cream (MYCOSTATIN), Apply 1 application topically 2 times per day to affected area, Disp: 30 g, Rfl: 0 ?  ONETOUCH ULTRA test strip, USE TO TEST SUGAR TWICE DAILY, Disp: 300 strip, Rfl: 2 ?  progesterone (PROMETRIUM) 100 MG capsule, Take 50 mg by mouth at bedtime. Except Sundays, Disp: , Rfl:  ?  rosuvastatin (CRESTOR) 10 MG tablet, TAKE 1 TABLET(10 MG) BY MOUTH DAILY, Disp: 90 tablet, Rfl: 1 ?  telmisartan-hydrochlorothiazide (MICARDIS HCT) 40-12.5 MG tablet, TAKE 1 TABLET BY MOUTH DAILY, Disp: 90 tablet, Rfl: 1 ?  amoxicillin-clavulanate (AUGMENTIN) 875-125 MG tablet, Take 1 tablet by mouth 2 (two) times daily. (Patient not taking: Reported on 05/05/2021), Disp: , Rfl:  ?  BIOTIN PO, Take 2,000 mg by mouth daily at 6 (six) AM., Disp: , Rfl:  ?  fluticasone (FLONASE) 50 MCG/ACT nasal spray, Place into both nostrils. (Patient not taking: Reported on 05/05/2021), Disp: , Rfl:  ?  metFORMIN (GLUCOPHAGE) 500 MG tablet, TAKE 1 TABLET BY MOUTH TWICE DAILY, Disp: 180 tablet, Rfl: 2 ?  promethazine-dextromethorphan (PROMETHAZINE-DM) 6.25-15 MG/5ML syrup, Take 5 mLs by mouth 3 (three) times daily as needed for cough., Disp: 118 mL, Rfl: 0  ? ?No Known Allergies  ? ?Review  of Systems  ?Constitutional:  Positive for fatigue.  ?Respiratory:  Positive for cough and shortness of breath.   ?Cardiovascular: Negative.   ?Gastrointestinal: Negative.   ?Neurological: Negative.   ?Psychiatric/Behavioral:  Positive for dysphoric mood.    ? ?Today's Vitals  ? 05/05/21 0942  ?BP: 114/72  ?Pulse: (!) 58  ?Temp: 98.1 ?F (36.7 ?C)  ?SpO2: 98%  ?Weight: 210 lb 9.6 oz (95.5 kg)  ?Height: 5' 3.6" (1.615 m)  ? ?Body mass index is 36.61 kg/m?.  ?Wt Readings from Last 3 Encounters:  ?05/05/21 210 lb 9.6 oz (95.5 kg)  ?01/29/21 212 lb 3.2 oz (96.3 kg)  ?09/25/20 210 lb 9.6 oz (95.5 kg)  ?  ?BP Readings from Last 3 Encounters:  ?05/05/21 114/72  ?01/29/21 118/74  ?09/25/20 124/78  ?  ?Objective:   ?Physical Exam ?Vitals and nursing note reviewed.  ?Constitutional:   ?   Appearance: Normal appearance.  ?HENT:  ?   Head: Normocephalic and atraumatic.  ?   Right Ear: Tympanic membrane, ear canal and external ear normal. There is no impacted cerumen.  ?   Left Ear: Tympanic membrane, ear canal and external ear normal. There is no impacted cerumen.  ?   Nose:  ?   Comments: Masked ?   Mouth/Throat:  ?   Comments: Masked  ?Eyes:  ?   Extraocular Movements: Extraocular movements intact.  ?Cardiovascular:  ?   Rate and Rhythm: Normal rate and regular rhythm.  ?   Heart sounds: Normal heart sounds.  ?Pulmonary:  ?   Effort: Pulmonary effort is normal.  ?   Breath sounds: Normal breath sounds.  ?Musculoskeletal:  ?   Cervical back: Normal range of motion.  ?Skin: ?   General: Skin is warm.  ?Neurological:  ?   General: No focal deficit present.  ?   Mental Status: She is alert.  ?Psychiatric:     ?   Mood and Affect: Mood normal.     ?   Behavior: Behavior normal.  ?    ?Assessment And Plan:  ?   ?1. Acute cough ?Comments: Persistent. I will refer her for CXR.  She was given Kenalog,$RemoveBeforeDEI'40mg'[id number]$  IM x 1 and rx phenergan/dextromorphan syrup to use prn.  ?- DG Chest 2 View; Future ?- triamcinolone acetonide (KENALOG-40) injection 40 mg ? ?2. Uncontrolled type 2 diabetes mellitus with hyperglycemia ([place]) ?Comments: Chronic, I will check labs as below. I will adjust meds as needed.  She will rto in 4 months for re-evaluation.  ?- CMP14+EGFR ?- Hemoglobin A1c ?- CBC no Diff ? ?3. Essential hypertension ?Comments: Chronic, well controlled.  No med changes. Encouraged to follow low sodium diet.  ? ?4. Grief ?Comments: Unfortunately, her Mom has passed since her last visit. She agrees to consider grief counseling. I will refer her to [place]. ?- Ambulatory referral to Hospice ? ?5. Class 2 severe obesity due to excess calories with serious comorbidity and body mass index (BMI) of 36.0 to 36.9 in adult [facility]) ? She is encouraged to  strive for BMI less than 30 to decrease cardiac risk. Advised to aim for at least 150 minutes of exercise per week.  ? ?Patient was given opportunity to ask questions. Patient verbalized understanding of the plan and was able to repeat key elements of the plan. All questions were answered to their satisfaction.  ? ?I, Maximino Greenland, MD, have reviewed all documentation for this visit. The documentation on 05/05/21 for the exam, diagnosis, procedures, and orders  are all accurate and complete.  ? ?IF YOU HAVE BEEN REFERRED TO A SPECIALIST, IT MAY TAKE 1-2 WEEKS TO SCHEDULE/PROCESS THE REFERRAL. IF YOU HAVE NOT HEARD FROM US/SPECIALIST IN TWO WEEKS, PLEASE GIVE [place] A CALL AT [phone] X [phone].  ? ?THE PATIENT IS ENCOURAGED TO PRACTICE SOCIAL DISTANCING DUE TO THE COVID-19 PANDEMIC.   ?

## 2021-05-06 LAB — CMP14+EGFR
ALT: 15 IU/L (ref 0–32)
AST: 14 IU/L (ref 0–40)
Albumin/Globulin Ratio: 1.5 (ref 1.2–2.2)
Albumin: 4.1 g/dL (ref 3.8–4.9)
Alkaline Phosphatase: 88 IU/L (ref 44–121)
BUN/Creatinine Ratio: 12 (ref 9–23)
BUN: 11 mg/dL (ref 6–24)
Bilirubin Total: 0.4 mg/dL (ref 0.0–1.2)
CO2: 22 mmol/L (ref 20–29)
Calcium: 10.6 mg/dL — ABNORMAL HIGH (ref 8.7–10.2)
Chloride: 105 mmol/L (ref 96–106)
Creatinine, Ser: 0.93 mg/dL (ref 0.57–1.00)
Globulin, Total: 2.8 g/dL (ref 1.5–4.5)
Glucose: 98 mg/dL (ref 70–99)
Potassium: 4.1 mmol/L (ref 3.5–5.2)
Sodium: 143 mmol/L (ref 134–144)
Total Protein: 6.9 g/dL (ref 6.0–8.5)
eGFR: 71 mL/min/{1.73_m2} (ref >59)

## 2021-05-06 LAB — CBC
Hematocrit: 42.6 % (ref 34.0–46.6)
Hemoglobin: 13.8 g/dL (ref 11.1–15.9)
MCH: 25.7 pg — ABNORMAL LOW (ref 26.6–33.0)
MCHC: 32.4 g/dL (ref 31.5–35.7)
MCV: 79 fL (ref 79–97)
Platelets: 313 10*3/uL (ref 150–450)
RBC: 5.38 x10E6/uL — ABNORMAL HIGH (ref 3.77–5.28)
RDW: 15 % (ref 11.7–15.4)
WBC: 5.7 10*3/uL (ref 3.4–10.8)

## 2021-05-06 LAB — HEMOGLOBIN A1C
Est. average glucose Bld gHb Est-mCnc: 137 mg/dL
Hgb A1c MFr Bld: 6.4 % — ABNORMAL HIGH (ref 4.8–5.6)

## 2021-06-02 ENCOUNTER — Ambulatory Visit: Payer: Commercial Managed Care - PPO | Admitting: Internal Medicine

## 2021-06-07 ENCOUNTER — Other Ambulatory Visit: Payer: Self-pay | Admitting: Internal Medicine

## 2021-07-02 ENCOUNTER — Other Ambulatory Visit: Payer: Self-pay | Admitting: Internal Medicine

## 2021-07-03 MED ORDER — FLUTICASONE PROPIONATE 50 MCG/ACT NA SUSP: 1.0000 | Freq: Every day | NASAL | 1 refills | Status: DC

## 2021-08-09 ENCOUNTER — Emergency Department (HOSPITAL_BASED_OUTPATIENT_CLINIC_OR_DEPARTMENT_OTHER): Payer: Commercial Managed Care - PPO

## 2021-08-09 ENCOUNTER — Encounter (HOSPITAL_BASED_OUTPATIENT_CLINIC_OR_DEPARTMENT_OTHER): Payer: Self-pay | Admitting: Emergency Medicine

## 2021-08-09 ENCOUNTER — Emergency Department (HOSPITAL_BASED_OUTPATIENT_CLINIC_OR_DEPARTMENT_OTHER)
Admission: EM | Admit: 2021-08-09 | Discharge: 2021-08-09 | Disposition: A | Discharge status: Home / Self Care | Payer: Commercial Managed Care - PPO | Attending: Emergency Medicine | Admitting: Emergency Medicine

## 2021-08-09 ENCOUNTER — Other Ambulatory Visit: Payer: Self-pay

## 2021-08-09 DIAGNOSIS — E119 Type 2 diabetes mellitus without complications: Secondary | ICD-10-CM | POA: Diagnosis not present

## 2021-08-09 DIAGNOSIS — I10 Essential (primary) hypertension: Secondary | ICD-10-CM | POA: Insufficient documentation

## 2021-08-09 DIAGNOSIS — R001 Bradycardia, unspecified: Secondary | ICD-10-CM | POA: Insufficient documentation

## 2021-08-09 DIAGNOSIS — Z79899 Other long term (current) drug therapy: Secondary | ICD-10-CM | POA: Diagnosis not present

## 2021-08-09 DIAGNOSIS — Z7984 Long term (current) use of oral hypoglycemic drugs: Secondary | ICD-10-CM | POA: Diagnosis not present

## 2021-08-09 DIAGNOSIS — Z7982 Long term (current) use of aspirin: Secondary | ICD-10-CM | POA: Diagnosis not present

## 2021-08-09 DIAGNOSIS — R5383 Other fatigue: Secondary | ICD-10-CM | POA: Diagnosis not present

## 2021-08-09 HISTORY — DX: Hyperlipidemia, unspecified: E78.5

## 2021-08-09 LAB — CBC
HCT: 43.9 % (ref 36.0–46.0)
Hemoglobin: 13.8 g/dL (ref 12.0–15.0)
MCH: 25.9 pg — ABNORMAL LOW (ref 26.0–34.0)
MCHC: 31.4 g/dL (ref 30.0–36.0)
MCV: 82.4 fL (ref 80.0–100.0)
Platelets: 294 10*3/uL (ref 150–400)
RBC: 5.33 MIL/uL — ABNORMAL HIGH (ref 3.87–5.11)
RDW: 15.7 % — ABNORMAL HIGH (ref 11.5–15.5)
WBC: 6.8 10*3/uL (ref 4.0–10.5)
nRBC: 0 % (ref 0.0–0.2)

## 2021-08-09 LAB — CBG MONITORING, ED: Glucose-Capillary: 78 mg/dL (ref 70–99)

## 2021-08-09 LAB — BASIC METABOLIC PANEL
Anion gap: 5 (ref 5–15)
BUN: 13 mg/dL (ref 6–20)
CO2: 26 mmol/L (ref 22–32)
Calcium: 9.8 mg/dL (ref 8.9–10.3)
Chloride: 109 mmol/L (ref 98–111)
Creatinine, Ser: 0.73 mg/dL (ref 0.44–1.00)
GFR, Estimated: >60 mL/min (ref >60)
Glucose, Bld: 83 mg/dL (ref 70–99)
Potassium: 3.2 mmol/L — ABNORMAL LOW (ref 3.5–5.1)
Sodium: 140 mmol/L (ref 135–145)

## 2021-08-09 LAB — TROPONIN I (HIGH SENSITIVITY): Troponin I (High Sensitivity): 4 ng/L (ref <18)

## 2021-08-09 LAB — URINALYSIS, ROUTINE W REFLEX MICROSCOPIC
Bilirubin Urine: NEGATIVE
Glucose, UA: NEGATIVE mg/dL
Ketones, ur: NEGATIVE mg/dL
Nitrite: NEGATIVE
Protein, ur: NEGATIVE mg/dL
Specific Gravity, Urine: 1.02 (ref 1.005–1.030)
pH: 7 (ref 5.0–8.0)

## 2021-08-09 LAB — HEPATIC FUNCTION PANEL
ALT: 21 U/L (ref 0–44)
AST: 18 U/L (ref 15–41)
Albumin: 3.5 g/dL (ref 3.5–5.0)
Alkaline Phosphatase: 69 U/L (ref 38–126)
Bilirubin, Direct: <0.1 mg/dL (ref 0.0–0.2)
Total Bilirubin: 0.5 mg/dL (ref 0.3–1.2)
Total Protein: 6.6 g/dL (ref 6.5–8.1)

## 2021-08-09 LAB — URINALYSIS, MICROSCOPIC (REFLEX)

## 2021-08-09 MED ORDER — SODIUM CHLORIDE 0.9 % IV BOLUS
1000.0000 mL | Freq: Once | INTRAVENOUS | Status: AC
  Administered 2021-08-09: 1000 mL via INTRAVENOUS

## 2021-08-09 NOTE — ED Provider Notes (Signed)
[facility] Provider Note   CSN: [id number] Arrival date & time: 08/09/21  1722     History  Chief Complaint  Patient presents with   Fatigue    Katelyn Schmidt is a 61 y.o. female.  Patient here with some generalized fatigue over the last 7 days.  Having some hot flashes, dizziness at times, generalized weakness.  Feels dehydrated.  Denies any nausea, vomiting, chest pain, abdominal pain, syncope, black stools or bloody stools or pain with urination.  Denies any stroke symptoms.  History of diabetes, hypertension, high cholesterol.  Nothing makes it worse or better.  The history is provided by the patient.       Home Medications Prior to Admission medications   Medication Sig Start Date End Date Taking? Authorizing Provider  amLODipine (NORVASC) 10 MG tablet TAKE 1 TABLET BY MOUTH EVERY DAY 03/02/21   Glendale Chard, MD  amoxicillin-clavulanate (AUGMENTIN) 875-125 MG tablet Take 1 tablet by mouth 2 (two) times daily. Patient not taking: Reported on 05/05/2021 04/13/21   [provider]  Ascorbic Acid (VITAMIN C) 1000 MG tablet Take by mouth daily.     [provider]  aspirin 81 MG tablet Take 81 mg by mouth daily.    [provider]  B Complex Vitamins (B COMPLEX PO) Take by mouth.    [provider]  BIOTIN PO Take 2,000 mg by mouth daily at 6 (six) AM.    [provider]  fluticasone (FLONASE) 50 MCG/ACT nasal spray Place 1 spray into both nostrils daily. 07/03/21   Glendale Chard, MD  metFORMIN (GLUCOPHAGE) 500 MG tablet TAKE 1 TABLET BY MOUTH TWICE DAILY 05/05/21   Glendale Chard, MD  Multiple Vitamins-Minerals (MULTIVITAMIN PO) Take 1 tablet by mouth daily.    [provider]  nystatin cream (MYCOSTATIN) Apply 1 application topically 2 times per day to affected area 09/26/19   Glendale Chard, MD  [facility] ULTRA test strip USE TO TEST SUGAR TWICE DAILY 07/22/20   Glendale Chard, MD  progesterone  (PROMETRIUM) 100 MG capsule Take 50 mg by mouth at bedtime. Except Sundays    [provider]  promethazine-dextromethorphan (PROMETHAZINE-DM) 6.25-15 MG/5ML syrup Take 5 mLs by mouth 3 (three) times daily as needed for cough. 05/05/21   Glendale Chard, MD  rosuvastatin (CRESTOR) 10 MG tablet TAKE 1 TABLET(10 MG) BY MOUTH DAILY 01/19/21   Glendale Chard, MD  telmisartan-hydrochlorothiazide (MICARDIS HCT) 40-12.5 MG tablet TAKE 1 TABLET BY MOUTH DAILY 06/08/21   Glendale Chard, MD      Allergies    Patient has no known allergies.    Review of Systems   Review of Systems  Physical Exam Updated Vital Signs BP 136/79   Pulse (!) 49   Temp 98.7 F (37.1 C) (Oral)   Resp 17   Ht '5\' 5"'$  (1.651 m)   Wt 95.3 kg   SpO2 100%   BMI 34.95 kg/m  Physical Exam Vitals and nursing note reviewed.  Constitutional:      General: She is not in acute distress.    Appearance: She is well-developed. She is not ill-appearing.  HENT:     Head: Normocephalic and atraumatic.     Nose: Nose normal.     Mouth/Throat:     Mouth: Mucous membranes are moist.  Eyes:     Extraocular Movements: Extraocular movements intact.     Conjunctiva/sclera: Conjunctivae normal.     Pupils: Pupils are equal, round, and reactive to light.  Cardiovascular:     Rate and Rhythm: Normal rate and regular rhythm.     Pulses: Normal pulses.     Heart sounds: Normal heart sounds. No murmur heard. Pulmonary:     Effort: Pulmonary effort is normal. No respiratory distress.     Breath sounds: Normal breath sounds.  Abdominal:     Palpations: Abdomen is soft.     Tenderness: There is no abdominal tenderness.  Musculoskeletal:        General: No swelling.     Cervical back: Normal range of motion and neck supple.  Skin:    General: Skin is warm and dry.     Capillary Refill: Capillary refill takes less than 2 seconds.  Neurological:     General: No focal deficit present.     Mental Status: She is alert and oriented  to person, place, and time.     Cranial Nerves: No cranial nerve deficit.     Sensory: No sensory deficit.     Motor: No weakness.     Coordination: Coordination normal.     Gait: Gait normal.     Comments: 5+ out of 5 strength throughout, normal sensation, no drift, normal speech, normal gait  Psychiatric:        Mood and Affect: Mood normal.     ED Results / Procedures / Treatments   Labs (all labs ordered are listed, but only abnormal results are displayed) Labs Reviewed  CBC - Abnormal; Notable for the following components:      Result Value   RBC 5.33 (*)    MCH 25.9 (*)    RDW 15.7 (*)    All other components within normal limits  URINALYSIS, ROUTINE W REFLEX MICROSCOPIC - Abnormal; Notable for the following components:   Hgb urine dipstick TRACE (*)    Leukocytes,Ua TRACE (*)    All other components within normal limits  URINALYSIS, MICROSCOPIC (REFLEX) - Abnormal; Notable for the following components:   Bacteria, UA FEW (*)    All other components within normal limits  BASIC METABOLIC PANEL - Abnormal; Notable for the following components:   Potassium 3.2 (*)    All other components within normal limits  HEPATIC FUNCTION PANEL  CBG MONITORING, ED  TROPONIN I (HIGH SENSITIVITY)    EKG EKG Interpretation  Date/Time:  Sunday August 09 2021 17:56:47 EDT Ventricular Rate:  46 PR Interval:  190 QRS Duration: 100 QT Interval:  396 QTC Calculation: 346 R Axis:   35 Text Interpretation: Sinus bradycardia with sinus arrhythmia Minimal voltage criteria for LVH, may be normal variant ( Cornell product ) Borderline ECG No previous ECGs available Confirmed by Lennice Sites ([id number]) on 08/09/2021 6:03:26 PM  Radiology CT Head Wo Contrast  Result Date: 08/09/2021 CLINICAL DATA:  Headache, new or worsening (Age >= 50y) EXAM: CT HEAD WITHOUT CONTRAST TECHNIQUE: Contiguous axial images were obtained from the base of the skull through the vertex without intravenous contrast.  RADIATION DOSE REDUCTION: This exam was performed according to the departmental dose-optimization program which includes automated exposure control, adjustment of the mA and/or kV according to patient size and/or use of iterative reconstruction technique. COMPARISON:  None Available. FINDINGS: Brain: No evidence of large-territorial acute infarction. No parenchymal hemorrhage. No mass lesion. No extra-axial collection. No mass effect or midline shift. No hydrocephalus. Basilar cisterns are patent. Vascular: No hyperdense vessel. Skull: No acute fracture or focal lesion. Sinuses/Orbits: Paranasal sinuses and mastoid air cells are clear. The orbits are unremarkable. Other: None.  IMPRESSION: No acute intracranial abnormality. Electronically Signed   By: Iven Finn M.D.   On: 08/09/2021 18:28   DG Chest Portable 1 View  Result Date: 08/09/2021 CLINICAL DATA:  Weakness and dizziness. EXAM: PORTABLE CHEST 1 VIEW COMPARISON:  05/05/2021 and prior studies FINDINGS: UPPER limits normal heart size noted. There is no evidence of focal airspace disease, pulmonary edema, suspicious pulmonary nodule/mass, pleural effusion, or pneumothorax. No acute bony abnormalities are identified. IMPRESSION: No active disease. Electronically Signed   By: Margarette [place] M.D.   On: 08/09/2021 18:20    Procedures Procedures    Medications Ordered in ED Medications  sodium chloride 0.9 % bolus 1,000 mL (1,000 mLs Intravenous New Bag/Given 08/09/21 1842)    ED Course/ Medical Decision Making/ A&P                           Medical Decision Making Amount and/or Complexity of Data Reviewed Labs: ordered. Radiology: ordered.   MOOREA BOISSONNEAULT is here with generalized weakness.  History of diabetes, hypertension, high cholesterol.  Overall unremarkable vitals.  EKG per my review and interpretation shows sinus bradycardia.  No ischemic changes.  Overall nonspecific symptoms.  We will do broad work-up.  Differential includes  symptomatic anemia, dehydration, electrolyte abnormality, infection, cardiac or pulmonary process.  Seems less likely to be stroke given normal neurological exam.  I have no concern will get head CT to evaluate for mass or bleed.  We will check troponin, CBC, CMP, urinalysis, chest x-ray, troponin.  Will give IV fluid bolus.  Urinalysis per my review and interpretation does not appear consistent with infection.  Chest x-ray per my review and interpretation shows no pneumonia or pneumothorax.  Head CT per my review interpretation shows no acute findings.  Overall Per further review of labs there is no significant anemia, electrolyte abnormality, kidney injury, leukocytosis.  Troponin is normal.  Overall work-up is unremarkable.  She is well-appearing.  We will have her continue work-up with follow-up with primary care doctor.  Could be vitamin deficiency or some other nonemergent process but at this time I think she is safe for discharge to home.  We will have her return if symptoms worsen.  This chart was dictated using voice recognition software.  Despite best efforts to proofread,  errors can occur which can change the documentation meaning.         Final Clinical Impression(s) / ED Diagnoses Final diagnoses:  Other fatigue    Rx / DC Orders ED Discharge Orders     None         Lennice Sites, DO 08/09/21 1943

## 2021-08-09 NOTE — ED Notes (Signed)
Patient to CT via stretcher.

## 2021-08-09 NOTE — ED Triage Notes (Signed)
Pt arrives pov, slow gait, c/o feeling light headed and hot flashes x 7 days. Not dizzy in triage, endorses "feeling weak". Denies fever, reports left arm weakness for "a few months"

## 2021-08-09 NOTE — Discharge Instructions (Signed)
Blood work is normal as discussed.  Recommend following up with primary care doctor to discuss checking vitamin levels, thyroid function.  Continue to get plenty of sleep.

## 2021-08-10 ENCOUNTER — Encounter: Payer: Self-pay | Admitting: Internal Medicine

## 2021-08-10 ENCOUNTER — Encounter: Payer: Self-pay | Admitting: Nurse Practitioner

## 2021-08-10 ENCOUNTER — Ambulatory Visit: Payer: Commercial Managed Care - PPO | Admitting: Nurse Practitioner

## 2021-08-10 VITALS — BP 130/62 | HR 59 | Temp 98.6°F | Ht 65.0 in | Wt 212.0 lb

## 2021-08-10 DIAGNOSIS — R82998 Other abnormal findings in urine: Secondary | ICD-10-CM

## 2021-08-10 DIAGNOSIS — E1165 Type 2 diabetes mellitus with hyperglycemia: Secondary | ICD-10-CM

## 2021-08-10 DIAGNOSIS — Z6835 Body mass index (BMI) 35.0-35.9, adult: Secondary | ICD-10-CM

## 2021-08-10 DIAGNOSIS — I10 Essential (primary) hypertension: Secondary | ICD-10-CM

## 2021-08-10 DIAGNOSIS — R42 Dizziness and giddiness: Secondary | ICD-10-CM | POA: Diagnosis not present

## 2021-08-10 DIAGNOSIS — R5383 Other fatigue: Secondary | ICD-10-CM

## 2021-08-10 MED ORDER — GVOKE HYPOPEN 2-PACK 1 MG/0.2ML ~~LOC~~ SOAJ: SUBCUTANEOUS | 2 refills | Status: DC

## 2021-08-10 MED ORDER — NITROFURANTOIN MONOHYD MACRO 100 MG PO CAPS: 100.0000 mg | ORAL_CAPSULE | Freq: Two times a day (BID) | ORAL | 0 refills | Status: AC

## 2021-08-10 NOTE — Patient Instructions (Signed)
Dizziness Dizziness is a common problem. It is a feeling of unsteadiness or light-headedness. You may feel like you are about to faint. Dizziness can lead to injury if you stumble or fall. Anyone can become dizzy, but dizziness is more common in older adults. This condition can be caused by a number of things, including medicines, dehydration, or illness. Follow these instructions at home: Eating and drinking  Drink enough fluid to keep your urine pale yellow. This helps to keep you from becoming dehydrated. Try to drink more clear fluids, such as water. Do not drink alcohol. Limit your caffeine intake if told to do so by your health care provider. Check ingredients and nutrition facts to see if a food or beverage contains caffeine. Limit your salt (sodium) intake if told to do so by your health care provider. Check ingredients and nutrition facts to see if a food or beverage contains sodium. Activity  Avoid making quick movements. Rise slowly from chairs and steady yourself until you feel okay. In the morning, first sit up on the side of the bed. When you feel okay, stand slowly while you hold onto something until you know that your balance is good. If you need to stand in one place for a long time, move your legs often. Tighten and relax the muscles in your legs while you are standing. Do not drive or use machinery if you feel dizzy. Avoid bending down if you feel dizzy. Place items in your home so that they are easy for you to reach without leaning over. Lifestyle Do not use any products that contain nicotine or tobacco. These products include cigarettes, chewing tobacco, and vaping devices, such as e-cigarettes. If you need help quitting, ask your health care provider. Try to reduce your stress level by using methods such as yoga or meditation. Talk with your health care provider if you need help to manage your stress. General instructions Watch your dizziness for any changes. Take  over-the-counter and prescription medicines only as told by your health care provider. Talk with your health care provider if you think that your dizziness is caused by a medicine that you are taking. Tell a friend or a family member that you are feeling dizzy. If he or she notices any changes in your behavior, have this person call your health care provider. Keep all follow-up visits. This is important. Contact a health care provider if: Your dizziness does not go away or you have new symptoms. Your dizziness or light-headedness gets worse. You feel nauseous. You have reduced hearing. You have a fever. You have neck pain or a stiff neck. Your dizziness leads to an injury or a fall. Get help right away if: You vomit or have diarrhea and are unable to eat or drink anything. You have problems talking, walking, swallowing, or using your arms, hands, or legs. You feel generally weak. You have any bleeding. You are not thinking clearly or you have trouble forming sentences. It may take a friend or family member to notice this. You have chest pain, abdominal pain, shortness of breath, or sweating. Your vision changes or you develop a severe headache. These symptoms may represent a serious problem that is an emergency. Do not wait to see if the symptoms will go away. Get medical help right away. Call your local emergency services (911 in the [place] Do not drive yourself to the hospital. Summary Dizziness is a feeling of unsteadiness or light-headedness. This condition can be caused by a number of   things, including medicines, dehydration, or illness. Anyone can become dizzy, but dizziness is more common in older adults. Drink enough fluid to keep your urine pale yellow. Do not drink alcohol. Avoid making quick movements if you feel dizzy. Monitor your dizziness for any changes. This information is not intended to replace advice given to you by your health care provider. Make sure you discuss any  questions you have with your health care provider. Document Revised: 01/14/2020 Document Reviewed: 01/14/2020 Elsevier Patient Education  2023 Elsevier Inc.  

## 2021-08-10 NOTE — Progress Notes (Signed)
This visit occurred during the SARS-CoV-2 public health emergency.  Safety protocols were in place, including screening questions prior to the visit, additional usage of staff PPE, and extensive cleaning of exam room while observing appropriate contact time as indicated for disinfecting solutions.  Subjective:     Patient ID: Katelyn Schmidt , female    DOB: September 24, 1960 , 61 y.o.   MRN: [id number]   Chief Complaint  Patient presents with   Dizziness    HPI  Patient presents today for ED follow up. She states that she has been having episodes when she feels dizzy and shaky. She states these symptoms improves when she eats.   Dizziness Associated symptoms include weakness.     Past Medical History:  Diagnosis Date   Diabetes mellitus, type 2 ([place])    Dyslipidemia    Hyperlipidemia    Hypertension    Menorrhagia    fibroids   Obesity    Phlebitis    Varicose veins      Family History  Problem Relation Age of Onset   Hypertension Mother    Hypertension Father    Diabetes Father    Heart failure Father    Stroke Brother    Heart failure Brother      Current Outpatient Medications:    amLODipine (NORVASC) 10 MG tablet, TAKE 1 TABLET BY MOUTH EVERY DAY, Disp: 90 tablet, Rfl: 1   Ascorbic Acid (VITAMIN C) 1000 MG tablet, Take by mouth daily. , Disp: , Rfl:    aspirin 81 MG tablet, Take 81 mg by mouth daily., Disp: , Rfl:    B Complex Vitamins (B COMPLEX PO), Take by mouth., Disp: , Rfl:    metFORMIN (GLUCOPHAGE) 500 MG tablet, TAKE 1 TABLET BY MOUTH TWICE DAILY, Disp: 180 tablet, Rfl: 2   Multiple Vitamins-Minerals (MULTIVITAMIN PO), Take 1 tablet by mouth daily., Disp: , Rfl:    ONETOUCH ULTRA test strip, USE TO TEST SUGAR TWICE DAILY, Disp: 300 strip, Rfl: 2   progesterone (PROMETRIUM) 100 MG capsule, Take 50 mg by mouth at bedtime. Except Sundays, Disp: , Rfl:    rosuvastatin (CRESTOR) 10 MG tablet, TAKE 1 TABLET(10 MG) BY MOUTH DAILY, Disp: 90 tablet, Rfl: 1    telmisartan-hydrochlorothiazide (MICARDIS HCT) 40-12.5 MG tablet, TAKE 1 TABLET BY MOUTH DAILY, Disp: 90 tablet, Rfl: 1   nystatin cream (MYCOSTATIN), Apply 1 application topically 2 times per day to affected area (Patient not taking: Reported on 08/10/2021), Disp: 30 g, Rfl: 0   No Known Allergies   Review of Systems  Neurological:  Positive for dizziness and weakness.     Today's Vitals   08/10/21 1428  Temp: 98.6 F (37 C)  TempSrc: Oral  Weight: 212 lb (96.2 kg)   Body mass index is 35.28 kg/m.   Objective:  Physical Exam      Assessment And Plan:     There are no diagnoses linked to this encounter.    Patient was given opportunity to ask questions. Patient verbalized understanding of the plan and was able to repeat key elements of the plan. All questions were answered to their satisfaction.  Tonie Griffith, [place], Tonie Griffith, CMA, have reviewed all documentation for this visit. The documentation on 08/10/21 for the exam, diagnosis, procedures, and orders are all accurate and complete.   IF YOU HAVE BEEN REFERRED TO A SPECIALIST, IT MAY TAKE 1-2 WEEKS TO SCHEDULE/PROCESS THE REFERRAL. IF YOU HAVE NOT HEARD FROM US/SPECIALIST IN  TWO WEEKS, PLEASE GIVE [place] A CALL AT [phone].   THE PATIENT IS ENCOURAGED TO PRACTICE SOCIAL DISTANCING DUE TO THE COVID-19 PANDEMIC.

## 2021-08-10 NOTE — Progress Notes (Signed)
This visit occurred during the SARS-CoV-2 public health emergency.  Safety protocols were in place, including screening questions prior to the visit, additional usage of staff PPE, and extensive cleaning of exam room while observing appropriate contact time as indicated for disinfecting solutions.  Subjective:     Patient ID: Katelyn Schmidt , female    DOB: Mar 23, 1960 , 61 y.o.   MRN: [id number]   Chief Complaint  Patient presents with   Dizziness    HPI  Here for ER follow up due to having dizziness. She drinks at least 5-6 (16 oz) bottles of water. She reports having a severe dehydration, tongue will stick to the roof of her mouth. Denies being a mouth breather or using a CPAP. Fasting sugar levels are 85-105. Last week was 81. She has cut back on taking her second dose of metformin.   Wt Readings from Last 3 Encounters: 08/10/21 : 212 lb (96.2 kg) 08/09/21 : 210 lb (95.3 kg) 05/05/21 : 210 lb 9.6 oz (95.5 kg)    Dizziness This is a new problem. The current episode started in the past 7 days. The problem occurs intermittently (3 times in the last week). Associated symptoms include fatigue and headaches (last 2 weeks). Associated symptoms comments: Will get confused during that time of the episode, jittery. She has tried eating for the symptoms.     Past Medical History:  Diagnosis Date   Diabetes mellitus, type 2 ([place])    Dyslipidemia    Hyperlipidemia    Hypertension    Menorrhagia    fibroids   Obesity    Phlebitis    Varicose veins      Family History  Problem Relation Age of Onset   Hypertension Mother    Hypertension Father    Diabetes Father    Heart failure Father    Stroke Brother    Heart failure Brother      Current Outpatient Medications:    amLODipine (NORVASC) 10 MG tablet, TAKE 1 TABLET BY MOUTH EVERY DAY, Disp: 90 tablet, Rfl: 1   Ascorbic Acid (VITAMIN C) 1000 MG tablet, Take by mouth daily. , Disp: , Rfl:    aspirin 81 MG tablet, Take 81 mg by  mouth daily., Disp: , Rfl:    B Complex Vitamins (B COMPLEX PO), Take by mouth., Disp: , Rfl:    Glucagon (GVOKE HYPOPEN 2-PACK) 1 MG/0.2ML SOAJ, Inject '1mg'$  when blood sugar is less that 60., Disp: 0.4 mL, Rfl: 2   metFORMIN (GLUCOPHAGE) 500 MG tablet, TAKE 1 TABLET BY MOUTH TWICE DAILY, Disp: 180 tablet, Rfl: 2   Multiple Vitamins-Minerals (MULTIVITAMIN PO), Take 1 tablet by mouth daily., Disp: , Rfl:    nitrofurantoin, macrocrystal-monohydrate, (MACROBID) 100 MG capsule, Take 1 capsule (100 mg total) by mouth 2 (two) times daily for 5 days., Disp: 10 capsule, Rfl: 0   ONETOUCH ULTRA test strip, USE TO TEST SUGAR TWICE DAILY, Disp: 300 strip, Rfl: 2   progesterone (PROMETRIUM) 100 MG capsule, Take 50 mg by mouth at bedtime. Except Sundays, Disp: , Rfl:    rosuvastatin (CRESTOR) 10 MG tablet, TAKE 1 TABLET(10 MG) BY MOUTH DAILY, Disp: 90 tablet, Rfl: 1   telmisartan-hydrochlorothiazide (MICARDIS HCT) 40-12.5 MG tablet, TAKE 1 TABLET BY MOUTH DAILY, Disp: 90 tablet, Rfl: 1   nystatin cream (MYCOSTATIN), Apply 1 application topically 2 times per day to affected area (Patient not taking: Reported on 08/10/2021), Disp: 30 g, Rfl: 0   No Known Allergies   Review of  Systems  Constitutional:  Positive for fatigue.  Respiratory: Negative.    Cardiovascular: Negative.   Gastrointestinal: Negative.   Neurological:  Positive for dizziness and headaches (last 2 weeks).  Psychiatric/Behavioral: Negative.       Today's Vitals   08/10/21 1428  BP: 130/62  Pulse: (!) 59  Temp: 98.6 F (37 C)  TempSrc: Oral  Weight: 212 lb (96.2 kg)  Height: '5\' 5"'$  (1.651 m)   Body mass index is 35.28 kg/m.   Objective:  Physical Exam Vitals reviewed.  Constitutional:      General: She is not in acute distress.    Appearance: Normal appearance. She is obese.  Cardiovascular:     Rate and Rhythm: Normal rate and regular rhythm.     Pulses: Normal pulses.     Heart sounds: Normal heart sounds. No murmur  heard. Neurological:     General: No focal deficit present.     Mental Status: She is alert and oriented to person, place, and time.     Cranial Nerves: No cranial nerve deficit.     Motor: No weakness.         Assessment And Plan:     1. Uncontrolled type 2 diabetes mellitus with hyperglycemia ([place]) Comments: She is to check her blood sugar as soon as she has the symptoms of hypoglycemia and let us know her reading. She does not have a phone compatable w/ dexcom - Glucagon (GVOKE HYPOPEN 2-PACK) 1 MG/0.2ML SOAJ; Inject '1mg'$  when blood sugar is less that 60.  Dispense: 0.4 mL; Refill: 2  2. Essential hypertension Comments: Blood pressure is fairly controlled. Continue current medications  3. Dizziness Comments: Encouraged to stay well hydrated with water.  - Urine Culture - TSH  4. Class 2 severe obesity due to excess calories with serious comorbidity and body mass index (BMI) of 35.0 to 35.9 in adult (HCC)  5. Other fatigue Comments: Will check for metabolic cause  - Urine Culture - TSH  6. Urine white blood cells increased Comments: Will send urine for culture and treat empirically due to symptoms.  - nitrofurantoin, macrocrystal-monohydrate, (MACROBID) 100 MG capsule; Take 1 capsule (100 mg total) by mouth 2 (two) times daily for 5 days.  Dispense: 10 capsule; Refill: 0     Patient was given opportunity to ask questions. Patient verbalized understanding of the plan and was able to repeat key elements of the plan. All questions were answered to their satisfaction.  Minette Brine, FNP   I, Minette Brine, FNP, have reviewed all documentation for this visit. The documentation on 08/10/21 for the exam, diagnosis, procedures, and orders are all accurate and complete.   IF YOU HAVE BEEN REFERRED TO A SPECIALIST, IT MAY TAKE 1-2 WEEKS TO SCHEDULE/PROCESS THE REFERRAL. IF YOU HAVE NOT HEARD FROM US/SPECIALIST IN TWO WEEKS, PLEASE GIVE [place] A CALL AT [phone].   THE PATIENT  IS ENCOURAGED TO PRACTICE SOCIAL DISTANCING DUE TO THE COVID-19 PANDEMIC.

## 2021-08-11 LAB — URINE CULTURE: Organism ID, Bacteria: NO GROWTH

## 2021-08-11 LAB — TSH: TSH: 1.67 u[IU]/mL (ref 0.450–4.500)

## 2021-08-21 ENCOUNTER — Telehealth: Payer: Self-pay

## 2021-08-21 NOTE — Telephone Encounter (Signed)
PA for GVOKE sent to plan using rxb.[email or web address]

## 2021-08-24 ENCOUNTER — Telehealth: Payer: Self-pay

## 2021-08-24 NOTE — Telephone Encounter (Signed)
Patient called to inform her that her GVOKE HYPOPEN 2-pk '1mg'$ /0.2ML has been approved from 08/21/2021-08/21/2022.

## 2021-08-27 ENCOUNTER — Other Ambulatory Visit: Payer: Self-pay | Admitting: Internal Medicine

## 2021-09-03 ENCOUNTER — Other Ambulatory Visit: Payer: Self-pay | Admitting: Internal Medicine

## 2021-09-24 ENCOUNTER — Other Ambulatory Visit: Payer: Self-pay | Admitting: Internal Medicine

## 2021-09-24 MED ORDER — NYSTATIN 100000 UNIT/GM EX CREA: TOPICAL_CREAM | CUTANEOUS | 0 refills | Status: AC

## 2021-10-07 ENCOUNTER — Encounter: Payer: Self-pay | Admitting: Internal Medicine

## 2021-10-07 ENCOUNTER — Ambulatory Visit: Payer: Commercial Managed Care - PPO | Admitting: Internal Medicine

## 2021-10-07 VITALS — BP 136/68 | HR 60 | Temp 99.2°F | Ht 65.0 in | Wt 203.6 lb

## 2021-10-07 DIAGNOSIS — J208 Acute bronchitis due to other specified organisms: Secondary | ICD-10-CM

## 2021-10-07 DIAGNOSIS — I10 Essential (primary) hypertension: Secondary | ICD-10-CM

## 2021-10-07 DIAGNOSIS — Z6835 Body mass index (BMI) 35.0-35.9, adult: Secondary | ICD-10-CM

## 2021-10-07 DIAGNOSIS — E1165 Type 2 diabetes mellitus with hyperglycemia: Secondary | ICD-10-CM

## 2021-10-07 LAB — POC COVID19 BINAXNOW: SARS Coronavirus 2 Ag: NEGATIVE

## 2021-10-07 MED ORDER — ALBUTEROL SULFATE HFA 108 (90 BASE) MCG/ACT IN AERS: 2.0000 | INHALATION_SPRAY | Freq: Four times a day (QID) | RESPIRATORY_TRACT | 2 refills | Status: DC | PRN

## 2021-10-07 MED ORDER — HYDROCODONE BIT-HOMATROP MBR 5-1.5 MG/5ML PO SOLN: 5.0000 mL | Freq: Four times a day (QID) | ORAL | 0 refills | Status: DC | PRN

## 2021-10-07 NOTE — Progress Notes (Signed)
Barnet Glasgow Martin,acting as a Education administrator for Maximino Greenland, MD.,have documented all relevant documentation on the behalf of Maximino Greenland, MD,as directed by  Maximino Greenland, MD while in the presence of Maximino Greenland, MD.    Subjective:     Patient ID: Katelyn Schmidt , female    DOB: May 23, 1960 , 61 y.o.   MRN: [id number]   Chief Complaint  Patient presents with   Nasal Congestion    HPI  She presents today for further evaluation of possible URI. Patient states she has been having chest congestion for about 2 weeks.  She initially thought she was getting better, but her sx worsened today. She has cough productive of yellowish mucus. Denies fever/chills, known sick contacts. She has taken a lot of OTC meds with some reliefof her sx. Patient states her sides are sore from coughing. Rapid covid is negative.   BP Readings from Last 3 Encounters: 10/07/21 : 136/68 08/10/21 : 130/62 08/09/21 : 117/60       Past Medical History:  Diagnosis Date   Diabetes mellitus, type 2 ([place])    Dyslipidemia    Hyperlipidemia    Hypertension    Menorrhagia    fibroids   Obesity    Phlebitis    Varicose veins      Family History  Problem Relation Age of Onset   Hypertension Mother    Hypertension Father    Diabetes Father    Heart failure Father    Stroke Brother    Heart failure Brother      Current Outpatient Medications:    albuterol (VENTOLIN HFA) 108 (90 Base) MCG/ACT inhaler, Inhale 2 puffs into the lungs every 6 (six) hours as needed for wheezing or shortness of breath., Disp: 8 g, Rfl: 2   amLODipine (NORVASC) 10 MG tablet, TAKE 1 TABLET BY MOUTH EVERY DAY, Disp: 90 tablet, Rfl: 1   Ascorbic Acid (VITAMIN C) 1000 MG tablet, Take by mouth daily. , Disp: , Rfl:    aspirin 81 MG tablet, Take 81 mg by mouth daily., Disp: , Rfl:    B Complex Vitamins (B COMPLEX PO), Take by mouth., Disp: , Rfl:    Glucagon (GVOKE HYPOPEN 2-PACK) 1 MG/0.2ML SOAJ, Inject '1mg'$  when blood sugar is  less that 60., Disp: 0.4 mL, Rfl: 2   HYDROcodone bit-homatropine (HYDROMET) 5-1.5 MG/5ML syrup, Take 5 mLs by mouth every 6 (six) hours as needed for cough., Disp: 120 mL, Rfl: 0   metFORMIN (GLUCOPHAGE) 500 MG tablet, TAKE 1 TABLET BY MOUTH TWICE DAILY, Disp: 180 tablet, Rfl: 2   Multiple Vitamins-Minerals (MULTIVITAMIN PO), Take 1 tablet by mouth daily., Disp: , Rfl:    nystatin cream (MYCOSTATIN), Apply 1 application topically 2 times per day to affected area, Disp: 30 g, Rfl: 0   ONETOUCH ULTRA test strip, USE TO TEST SUGAR TWICE DAILY, Disp: 300 strip, Rfl: 2   progesterone (PROMETRIUM) 100 MG capsule, Take 50 mg by mouth at bedtime. Except Sundays, Disp: , Rfl:    rosuvastatin (CRESTOR) 10 MG tablet, TAKE 1 TABLET(10 MG) BY MOUTH DAILY, Disp: 90 tablet, Rfl: 1   telmisartan-hydrochlorothiazide (MICARDIS HCT) 40-12.5 MG tablet, TAKE 1 TABLET BY MOUTH DAILY, Disp: 90 tablet, Rfl: 1   No Known Allergies   Review of Systems  Constitutional: Negative.   HENT:  Positive for congestion.   Respiratory:  Positive for cough.   Cardiovascular: Negative.   Gastrointestinal: Negative.   Neurological: Negative.   Psychiatric/Behavioral: Negative.  Today's Vitals   10/07/21 1416  BP: 136/68  Pulse: 60  Temp: 99.2 F (37.3 C)  TempSrc: Oral  Weight: 203 lb 9.6 oz (92.4 kg)  Height: '5\' 5"'$  (1.651 m)  PainSc: 0-No pain   Body mass index is 33.88 kg/m.  Wt Readings from Last 3 Encounters:  10/07/21 203 lb 9.6 oz (92.4 kg)  08/10/21 212 lb (96.2 kg)  08/09/21 210 lb (95.3 kg)     Objective:  Physical Exam Vitals and nursing note reviewed.  Constitutional:      Appearance: Normal appearance.  HENT:     Head: Normocephalic and atraumatic.     Right Ear: Tympanic membrane, ear canal and external ear normal. There is no impacted cerumen.     Left Ear: Tympanic membrane, ear canal and external ear normal.  Eyes:     Extraocular Movements: Extraocular movements intact.   Cardiovascular:     Rate and Rhythm: Normal rate and regular rhythm.     Heart sounds: Normal heart sounds.  Pulmonary:     Effort: Pulmonary effort is normal.     Breath sounds: Rhonchi present.  Musculoskeletal:     Cervical back: Normal range of motion.  Skin:    General: Skin is warm.  Neurological:     General: No focal deficit present.     Mental Status: She is alert.  Psychiatric:        Mood and Affect: Mood normal.        Behavior: Behavior normal.         Assessment And Plan:     1. Acute bronchitis due to other specified organisms Comments: She agrees to PCR COVID testing. She is advised to avoid dairy and to stay well hydrated. I will send rx as below. Encouraged to seek medical care if sx worsen. - POC COVID-19 BinaxNow - Novel Coronavirus, NAA ([facility]) - HYDROcodone bit-homatropine (HYDROMET) 5-1.5 MG/5ML syrup; Take 5 mLs by mouth every 6 (six) hours as needed for cough.  Dispense: 120 mL; Refill: 0 - albuterol (VENTOLIN HFA) 108 (90 Base) MCG/ACT inhaler; Inhale 2 puffs into the lungs every 6 (six) hours as needed for wheezing or shortness of breath.  Dispense: 8 g; Refill: 2  2. Essential hypertension Comments: Chronic, fair control. Goal BP<130/80.  I will not make any med changes today. She will rto in Sept 2023 for her next physical exam.   3. Class 2 severe obesity due to excess calories with serious comorbidity and body mass index (BMI) of 35.0 to 35.9 in adult [facility]) Comments: She was congratulated on her 9lb weight loss since June 2023. She is encouraged to aim for at least 150 minutes of exercise per week.    Patient was given opportunity to ask questions. Patient verbalized understanding of the plan and was able to repeat key elements of the plan. All questions were answered to their satisfaction.   I, Maximino Greenland, MD, have reviewed all documentation for this visit. The documentation on 10/07/21 for the exam, diagnosis, procedures, and orders are  all accurate and complete.   IF YOU HAVE BEEN REFERRED TO A SPECIALIST, IT MAY TAKE 1-2 WEEKS TO SCHEDULE/PROCESS THE REFERRAL. IF YOU HAVE NOT HEARD FROM US/SPECIALIST IN TWO WEEKS, PLEASE GIVE [place] A CALL AT [phone].   THE PATIENT IS ENCOURAGED TO PRACTICE SOCIAL DISTANCING DUE TO THE COVID-19 PANDEMIC.

## 2021-10-07 NOTE — Patient Instructions (Signed)

## 2021-10-08 LAB — NOVEL CORONAVIRUS, NAA: SARS-CoV-2, NAA: NOT DETECTED

## 2021-11-05 ENCOUNTER — Encounter: Payer: Self-pay | Admitting: Internal Medicine

## 2021-11-05 ENCOUNTER — Ambulatory Visit (INDEPENDENT_AMBULATORY_CARE_PROVIDER_SITE_OTHER): Payer: Commercial Managed Care - PPO | Admitting: Internal Medicine

## 2021-11-05 VITALS — BP 138/68 | HR 68 | Temp 98.5°F | Ht 65.0 in | Wt 205.4 lb

## 2021-11-05 DIAGNOSIS — E66811 Other obesity due to excess calories: Secondary | ICD-10-CM

## 2021-11-05 DIAGNOSIS — I10 Essential (primary) hypertension: Secondary | ICD-10-CM | POA: Diagnosis not present

## 2021-11-05 DIAGNOSIS — N6310 Unspecified lump in the right breast, unspecified quadrant: Secondary | ICD-10-CM | POA: Diagnosis not present

## 2021-11-05 DIAGNOSIS — E785 Hyperlipidemia, unspecified: Secondary | ICD-10-CM | POA: Diagnosis not present

## 2021-11-05 DIAGNOSIS — Z01419 Encounter for gynecological examination (general) (routine) without abnormal findings: Secondary | ICD-10-CM

## 2021-11-05 DIAGNOSIS — E1169 Type 2 diabetes mellitus with other specified complication: Secondary | ICD-10-CM | POA: Diagnosis not present

## 2021-11-05 DIAGNOSIS — E6609 Other obesity due to excess calories: Secondary | ICD-10-CM

## 2021-11-05 DIAGNOSIS — Z0000 Encounter for general adult medical examination without abnormal findings: Secondary | ICD-10-CM

## 2021-11-05 DIAGNOSIS — Z6834 Body mass index (BMI) 34.0-34.9, adult: Secondary | ICD-10-CM

## 2021-11-05 DIAGNOSIS — Z2821 Immunization not carried out because of patient refusal: Secondary | ICD-10-CM

## 2021-11-05 LAB — POCT URINALYSIS DIPSTICK
Bilirubin, UA: NEGATIVE
Glucose, UA: NEGATIVE
Ketones, UA: NEGATIVE
Nitrite, UA: NEGATIVE
Protein, UA: NEGATIVE
Spec Grav, UA: 1.01 (ref 1.010–1.025)
Urobilinogen, UA: 0.2 E.U./dL
pH, UA: 6.5 (ref 5.0–8.0)

## 2021-11-05 MED ORDER — METFORMIN HCL 500 MG PO TABS: ORAL_TABLET | ORAL | 2 refills | Status: DC

## 2021-11-05 NOTE — Patient Instructions (Signed)

## 2021-11-05 NOTE — Progress Notes (Signed)
Rich Brave [place],acting as a Education administrator for Maximino Greenland, MD.,have documented all relevant documentation on the behalf of Maximino Greenland, MD,as directed by  Maximino Greenland, MD while in the presence of Maximino Greenland, MD.   Subjective:     Patient ID: Katelyn Schmidt , female    DOB: 07-09-1960 , 61 y.o.   MRN: [id number]   Chief Complaint  Patient presents with  . Annual Exam  . Diabetes  . Hypertension    HPI  Patient here for HM. She is no longer followed by GYN. She is s/p hysterectomy. She reports compliance with meds. She denies headaches, chest pain and shortness of breath.   Diabetes She presents for her follow-up diabetic visit. She has type 2 diabetes mellitus. There are no hypoglycemic associated symptoms. There are no diabetic associated symptoms. Weakness: . There are no hypoglycemic complications. Symptoms are stable. Risk factors for coronary artery disease include diabetes mellitus, dyslipidemia, hypertension, post-menopausal and sedentary lifestyle. She is compliant with treatment most of the time. She participates in exercise intermittently. An ACE inhibitor/angiotensin II receptor blocker is being taken. She does not see a podiatrist.Eye exam is current.  Hypertension This is a chronic problem. The current episode started more than 1 year ago. The problem has been gradually improving since onset. The problem is controlled.     Past Medical History:  Diagnosis Date  . Diabetes mellitus, type 2 ([place])   . Dyslipidemia   . Hyperlipidemia   . Hypertension   . Menorrhagia    fibroids  . Obesity   . Phlebitis   . Varicose veins      Family History  Problem Relation Age of Onset  . Hypertension Mother   . Hypertension Father   . Diabetes Father   . Heart failure Father   . Stroke Brother   . Heart failure Brother      Current Outpatient Medications:  .  albuterol (VENTOLIN HFA) 108 (90 Base) MCG/ACT inhaler, Inhale 2 puffs into the lungs every 6 (six)  hours as needed for wheezing or shortness of breath., Disp: 8 g, Rfl: 2 .  amLODipine (NORVASC) 10 MG tablet, TAKE 1 TABLET BY MOUTH EVERY DAY, Disp: 90 tablet, Rfl: 1 .  Ascorbic Acid (VITAMIN C) 1000 MG tablet, Take by mouth daily. , Disp: , Rfl:  .  aspirin 81 MG tablet, Take 81 mg by mouth daily., Disp: , Rfl:  .  B Complex Vitamins (B COMPLEX PO), Take by mouth., Disp: , Rfl:  .  Glucagon (GVOKE HYPOPEN 2-PACK) 1 MG/0.2ML SOAJ, Inject '1mg'$  when blood sugar is less that 60., Disp: 0.4 mL, Rfl: 2 .  metFORMIN (GLUCOPHAGE) 500 MG tablet, TAKE 1 TABLET BY MOUTH TWICE DAILY, Disp: 180 tablet, Rfl: 2 .  Multiple Vitamins-Minerals (MULTIVITAMIN PO), Take 1 tablet by mouth daily., Disp: , Rfl:  .  nystatin cream (MYCOSTATIN), Apply 1 application topically 2 times per day to affected area, Disp: 30 g, Rfl: 0 .  ONETOUCH ULTRA test strip, USE TO TEST SUGAR TWICE DAILY, Disp: 300 strip, Rfl: 2 .  progesterone (PROMETRIUM) 100 MG capsule, Take 50 mg by mouth at bedtime. Except Sundays, Disp: , Rfl:  .  rosuvastatin (CRESTOR) 10 MG tablet, TAKE 1 TABLET(10 MG) BY MOUTH DAILY, Disp: 90 tablet, Rfl: 1 .  telmisartan-hydrochlorothiazide (MICARDIS HCT) 40-12.5 MG tablet, TAKE 1 TABLET BY MOUTH DAILY, Disp: 90 tablet, Rfl: 1   No Known Allergies    The patient states she  uses status post hysterectomy for birth control. Last LMP was No LMP recorded. Patient has had a hysterectomy.. Negative for Dysmenorrhea. Negative for: breast discharge, breast lump(s), breast pain and breast self exam. Associated symptoms include abnormal vaginal bleeding. Pertinent negatives include abnormal bleeding (hematology), anxiety, decreased libido, depression, difficulty falling sleep, dyspareunia, history of infertility, nocturia, sexual dysfunction, sleep disturbances, urinary incontinence, urinary urgency, vaginal discharge and vaginal itching. Diet regular.The patient states her exercise level is  moderate, 4-5 days per week.   .  The patient's tobacco use is:  Social History   Tobacco Use  Smoking Status Never  Smokeless Tobacco Never  . She has been exposed to passive smoke. The patient's alcohol use is:  Social History   Substance and Sexual Activity  Alcohol Use Yes   Comment: occas 1x mo    Review of Systems  Constitutional: Negative.   HENT: Negative.    Eyes: Negative.   Respiratory: Negative.    Cardiovascular: Negative.   Gastrointestinal: Negative.   Endocrine: Negative.   Genitourinary: Negative.   Musculoskeletal: Negative.   Skin: Negative.   Allergic/Immunologic: Negative.   Neurological: Negative.  Weakness: .  Hematological: Negative.   Psychiatric/Behavioral: Negative.       Today's Vitals   11/05/21 0903  BP: 138/68  Pulse: 68  Temp: 98.5 F (36.9 C)  TempSrc: Oral  Weight: 205 lb 6.4 oz (93.2 kg)  Height: '5\' 5"'$  (1.651 m)  PainSc: 0-No pain   Body mass index is 34.18 kg/m.  Wt Readings from Last 3 Encounters:  11/05/21 205 lb 6.4 oz (93.2 kg)  10/07/21 203 lb 9.6 oz (92.4 kg)  08/10/21 212 lb (96.2 kg)     Objective:  Physical Exam Chest:  Breasts:    Tanner Score is 5.     Left: Normal.       Comments: Mass palpated at 6o'clock, slightly tender to touch       Assessment And Plan:     1. Encounter for general adult medical examination w/o abnormal findings - Microalbumin / Creatinine Urine Ratio - POCT Urinalysis Dipstick (81002)  2. Encntr for gyn exam (general) (routine) w/o abn findings  3. Uncontrolled type 2 diabetes mellitus with hyperglycemia ([place])  4. Essential hypertension  5. Influenza vaccination declined  6. Class 1 obesity due to excess calories with serious comorbidity and body mass index (BMI) of 34.0 to 34.9 in adult     Patient was given opportunity to ask questions. Patient verbalized understanding of the plan and was able to repeat key elements of the plan. All questions were answered to their satisfaction.   I, Maximino Greenland, MD, have reviewed all documentation for this visit. The documentation on 11/05/21 for the exam, diagnosis, procedures, and orders are all accurate and complete.   THE PATIENT IS ENCOURAGED TO PRACTICE SOCIAL DISTANCING DUE TO THE COVID-19 PANDEMIC.

## 2021-11-07 LAB — CMP14+EGFR
ALT: 17 IU/L (ref 0–32)
AST: 18 IU/L (ref 0–40)
Albumin/Globulin Ratio: 1.8 (ref 1.2–2.2)
Albumin: 4.4 g/dL (ref 3.8–4.9)
Alkaline Phosphatase: 112 IU/L (ref 44–121)
BUN/Creatinine Ratio: 9 (ref 9–23)
BUN: 8 mg/dL (ref 6–24)
Bilirubin Total: 0.5 mg/dL (ref 0.0–1.2)
CO2: 24 mmol/L (ref 20–29)
Calcium: 10.9 mg/dL — ABNORMAL HIGH (ref 8.7–10.2)
Chloride: 103 mmol/L (ref 96–106)
Creatinine, Ser: 0.93 mg/dL (ref 0.57–1.00)
Globulin, Total: 2.5 g/dL (ref 1.5–4.5)
Glucose: 100 mg/dL — ABNORMAL HIGH (ref 70–99)
Potassium: 4.1 mmol/L (ref 3.5–5.2)
Sodium: 141 mmol/L (ref 134–144)
Total Protein: 6.9 g/dL (ref 6.0–8.5)
eGFR: 71 mL/min/{1.73_m2} (ref >59)

## 2021-11-07 LAB — HEMOGLOBIN A1C
Est. average glucose Bld gHb Est-mCnc: 143 mg/dL
Hgb A1c MFr Bld: 6.6 % — ABNORMAL HIGH (ref 4.8–5.6)

## 2021-11-07 LAB — CBC
Hematocrit: 45.7 % (ref 34.0–46.6)
Hemoglobin: 14 g/dL (ref 11.1–15.9)
MCH: 24.8 pg — ABNORMAL LOW (ref 26.6–33.0)
MCHC: 30.6 g/dL — ABNORMAL LOW (ref 31.5–35.7)
MCV: 81 fL (ref 79–97)
Platelets: 321 10*3/uL (ref 150–450)
RBC: 5.65 x10E6/uL — ABNORMAL HIGH (ref 3.77–5.28)
RDW: 15.4 % (ref 11.7–15.4)
WBC: 5.8 10*3/uL (ref 3.4–10.8)

## 2021-11-07 LAB — LIPID PANEL
Chol/HDL Ratio: 3.3 ratio (ref 0.0–4.4)
Cholesterol, Total: 151 mg/dL (ref 100–199)
HDL: 46 mg/dL (ref >39)
LDL Chol Calc (NIH): 87 mg/dL (ref 0–99)
Triglycerides: 95 mg/dL (ref 0–149)
VLDL Cholesterol Cal: 18 mg/dL (ref 5–40)

## 2021-11-07 LAB — MICROALBUMIN / CREATININE URINE RATIO
Creatinine, Urine: 71.4 mg/dL
Microalb/Creat Ratio: 32 mg/g creat — ABNORMAL HIGH (ref 0–29)
Microalbumin, Urine: 23 ug/mL

## 2021-11-07 LAB — TSH: TSH: 1.86 u[IU]/mL (ref 0.450–4.500)

## 2021-11-08 LAB — POC HEMOCCULT BLD/STL (OFFICE/1-CARD/DIAGNOSTIC): Fecal Occult Blood, POC: NEGATIVE

## 2021-11-09 ENCOUNTER — Other Ambulatory Visit: Payer: Self-pay | Admitting: Internal Medicine

## 2021-12-06 ENCOUNTER — Other Ambulatory Visit: Payer: Self-pay | Admitting: Internal Medicine

## 2021-12-10 NOTE — Telephone Encounter (Signed)
er

## 2021-12-22 ENCOUNTER — Ambulatory Visit
Admission: RE | Admit: 2021-12-22 | Discharge: 2021-12-22 | Disposition: A | Discharge status: Home / Self Care | Payer: Commercial Managed Care - PPO | Source: Ambulatory Visit | Attending: Internal Medicine | Admitting: Internal Medicine

## 2021-12-22 DIAGNOSIS — N6310 Unspecified lump in the right breast, unspecified quadrant: Secondary | ICD-10-CM

## 2021-12-23 ENCOUNTER — Other Ambulatory Visit: Payer: Self-pay | Admitting: Internal Medicine

## 2021-12-23 DIAGNOSIS — Z1231 Encounter for screening mammogram for malignant neoplasm of breast: Secondary | ICD-10-CM

## 2022-02-11 ENCOUNTER — Ambulatory Visit: Payer: Commercial Managed Care - PPO | Admitting: Internal Medicine

## 2022-02-11 ENCOUNTER — Encounter: Payer: Self-pay | Admitting: Internal Medicine

## 2022-02-11 VITALS — BP 110/78 | HR 52 | Temp 98.5°F | Wt 206.0 lb

## 2022-02-11 DIAGNOSIS — E1169 Type 2 diabetes mellitus with other specified complication: Secondary | ICD-10-CM

## 2022-02-11 DIAGNOSIS — E6609 Other obesity due to excess calories: Secondary | ICD-10-CM

## 2022-02-11 DIAGNOSIS — Z6834 Body mass index (BMI) 34.0-34.9, adult: Secondary | ICD-10-CM

## 2022-02-11 DIAGNOSIS — E785 Hyperlipidemia, unspecified: Secondary | ICD-10-CM | POA: Insufficient documentation

## 2022-02-11 DIAGNOSIS — I10 Essential (primary) hypertension: Secondary | ICD-10-CM

## 2022-02-11 MED ORDER — TELMISARTAN-HCTZ 40-12.5 MG PO TABS: 1.0000 | ORAL_TABLET | Freq: Every day | ORAL | 1 refills | Status: DC

## 2022-02-11 NOTE — Patient Instructions (Addendum)
Hypertension, Adult Hypertension is another name for high blood pressure. High blood pressure forces your heart to work harder to pump blood. This can cause problems over time. There are two numbers in a blood pressure reading. There is a top number (systolic) over a bottom number (diastolic). It is best to have a blood pressure that is below 120/80. What are the causes? The cause of this condition is not known. Some other conditions can lead to high blood pressure. What increases the risk? Some lifestyle factors can make you more likely to develop high blood pressure: Smoking. Not getting enough exercise or physical activity. Being overweight. Having too much fat, sugar, calories, or salt (sodium) in your diet. Drinking too much alcohol. Other risk factors include: Having any of these conditions: Heart disease. Diabetes. High cholesterol. Kidney disease. Obstructive sleep apnea. Having a family history of high blood pressure and high cholesterol. Age. The risk increases with age. Stress. What are the signs or symptoms? High blood pressure may not cause symptoms. Very high blood pressure (hypertensive crisis) may cause: Headache. Fast or uneven heartbeats (palpitations). Shortness of breath. Nosebleed. Vomiting or feeling like you may vomit (nauseous). Changes in how you see. Very bad chest pain. Feeling dizzy. Seizures. How is this treated? This condition is treated by making healthy lifestyle changes, such as: Eating healthy foods. Exercising more. Drinking less alcohol. Your doctor may prescribe medicine if lifestyle changes do not help enough and if: Your top number is above 130. Your bottom number is above 80. Your personal target blood pressure may vary. Follow these instructions at home: Eating and drinking  If told, follow the DASH eating plan. To follow this plan: Fill one half of your plate at each meal with fruits and vegetables. Fill one fourth of your plate  at each meal with whole grains. Whole grains include whole-wheat pasta, brown rice, and whole-grain bread. Eat or drink low-fat dairy products, such as skim milk or low-fat yogurt. Fill one fourth of your plate at each meal with low-fat (lean) proteins. Low-fat proteins include fish, chicken without skin, eggs, beans, and tofu. Avoid fatty meat, cured and processed meat, or chicken with skin. Avoid pre-made or processed food. Limit the amount of salt in your diet to less than 1,500 mg each day. Do not drink alcohol if: Your doctor tells you not to drink. You are pregnant, may be pregnant, or are planning to become pregnant. If you drink alcohol: Limit how much you have to: 0-1 drink a day for women. 0-2 drinks a day for men. Know how much alcohol is in your drink. In the [place] one drink equals one 12 oz bottle of beer (355 mL), one 5 oz glass of wine (148 mL), or one 1 oz glass of hard liquor (44 mL). Lifestyle  Work with your doctor to stay at a healthy weight or to lose weight. Ask your doctor what the best weight is for you. Get at least 30 minutes of exercise that causes your heart to beat faster (aerobic exercise) most days of the week. This may include walking, swimming, or biking. Get at least 30 minutes of exercise that strengthens your muscles (resistance exercise) at least 3 days a week. This may include lifting weights or doing Pilates. Do not smoke or use any products that contain nicotine or tobacco. If you need help quitting, ask your doctor. Check your blood pressure at home as told by your doctor. Keep all follow-up visits. Medicines Take over-the-counter and prescription medicines  only as told by your doctor. Follow directions carefully. Do not skip doses of blood pressure medicine. The medicine does not work as well if you skip doses. Skipping doses also puts you at risk for problems. Ask your doctor about side effects or reactions to medicines that you should watch  for. Contact a doctor if: You think you are having a reaction to the medicine you are taking. You have headaches that keep coming back. You feel dizzy. You have swelling in your ankles. You have trouble with your vision. Get help right away if: You get a very bad headache. You start to feel mixed up (confused). You feel weak or numb. You feel faint. You have very bad pain in your: Chest. Belly (abdomen). You vomit more than once. You have trouble breathing. These symptoms may be an emergency. Get help right away. Call 911. Do not wait to see if the symptoms will go away. Do not drive yourself to the hospital. Summary Hypertension is another name for high blood pressure. High blood pressure forces your heart to work harder to pump blood. For most people, a normal blood pressure is less than 120/80. Making healthy choices can help lower blood pressure. If your blood pressure does not get lower with healthy choices, you may need to take medicine. This information is not intended to replace advice given to you by your health care provider. Make sure you discuss any questions you have with your health care provider. Document Revised: 11/27/2020 Document Reviewed: 11/27/2020 Elsevier Patient Education  [place].  Type 2 Diabetes Mellitus, Diagnosis, Adult Type 2 diabetes (type 2 diabetes mellitus) is a long-term (chronic) disease. It may happen when there is one or both of these problems: The pancreas does not make enough insulin. The body does not react in a normal way to insulin that it makes. Insulin lets sugars go into cells in your body. If you have type 2 diabetes, sugars cannot get into your cells. Sugars build up in the blood. This causes high blood sugar. What are the causes? The exact cause of this condition is not known. What increases the risk? Having type 2 diabetes in your family. Being overweight or very overweight. Not being active. Your body not reacting  in a normal way to the insulin it makes. Having higher than normal blood sugar over time. Having a type of diabetes when you were pregnant. Having a condition that causes small fluid-filled sacs on your ovaries. What are the signs or symptoms? At first, you may have no symptoms. You will get symptoms slowly. They may include: More thirst than normal. More hunger than normal. Needing to pee more than normal. Losing weight without trying. Feeling tired. Feeling weak. Seeing things blurry. Dark patches on your skin. How is this treated? This condition may be treated by a diabetes expert. You may need to: Follow an eating plan made by a food expert (dietitian). Get regular exercise. Find ways to deal with stress. Check blood sugar as often as told. Take medicines. Your doctor will set treatment goals for you. Your blood sugar should be at these levels: Before meals: 80-130 mg/dL (4.4-7.2 mmol/L). After meals: below 180 mg/dL (10 mmol/L). Over the last 2-3 months: less than 7%. Follow these instructions at home: Medicines Take your diabetes medicines or insulin every day. Take medicines as told to help you prevent other problems caused by this condition. You may need: Aspirin. Medicine to lower cholesterol. Medicine to control blood pressure. Questions to  ask your doctor Should I meet with a diabetes educator? What medicines do I need, and when should I take them? What will I need to treat my condition at home? When should I check my blood sugar? Where can I find a support group? Who can I call if I have questions? When is my next doctor visit? General instructions Take over-the-counter and prescription medicines only as told by your doctor. Keep all follow-up visits. Where to find more information For help and guidance and more information about diabetes, please go to: American Diabetes Association (ADA): www.diabetes.org American Association of Diabetes Care and Education  Specialists (ADCES): www.diabeteseducator.org International Diabetes Federation (IDF): MemberVerification.ca Contact a doctor if: Your blood sugar is at or above 240 mg/dL (13.3 mmol/L) for 2 days in a row. You have been sick for 2 days or more, and you are not getting better. You have had a fever for 2 days or more, and you are not getting better. You have any of these problems for more than 6 hours: You cannot eat or drink. You feel like you may vomit. You vomit. You have watery poop (diarrhea). Get help right away if: Your blood sugar is lower than 54 mg/dL (3 mmol/L). You feel mixed up (confused). You have trouble thinking clearly. You have trouble breathing. You have medium or large ketone levels in your pee. These symptoms may be an emergency. Get help right away. Call your local emergency services (911 in the [place] Do not wait to see if the symptoms will go away. Do not drive yourself to the hospital. Summary Type 2 diabetes is a long-term disease. Your pancreas may not make enough insulin, or your body may not react in a normal way to insulin that it makes. This condition is treated with an eating plan, lifestyle changes, and medicines. Your doctor will set treatment goals for you. These will help you keep your blood sugar in a healthy range. Keep all follow-up visits. This information is not intended to replace advice given to you by your health care provider. Make sure you discuss any questions you have with your health care provider. Document Revised: 05/05/2020 Document Reviewed: 05/05/2020 Elsevier Patient Education  [place].

## 2022-02-11 NOTE — Progress Notes (Signed)
I,[name],acting as a scribe for Maximino Greenland, MD.,have documented all relevant documentation on the behalf of Maximino Greenland, MD,as directed by  Maximino Greenland, MD while in the presence of Maximino Greenland, MD.    Subjective:     Patient ID: Katelyn Schmidt , female    DOB: 08-Jun-1960 , 61 y.o.   MRN: [id number]   Chief Complaint  Patient presents with   Diabetes   Hypertension    HPI  Patient presents today for bp & dm f/u. She reports compliance with meds.  She has no specific concerns or questions at this time.  Denies SOB, chest pain, blurred vision.   Diabetes She presents for her follow-up diabetic visit. She has type 2 diabetes mellitus. There are no hypoglycemic complications. Symptoms are stable. Risk factors for coronary artery disease include diabetes mellitus, dyslipidemia, hypertension, post-menopausal and sedentary lifestyle. She is compliant with treatment most of the time. She participates in exercise intermittently. Her breakfast blood glucose is taken between 7-8 am. Her breakfast blood glucose range is generally 70-90 mg/dl. Eye exam is current.  Hypertension This is a chronic problem. The current episode started more than 1 year ago. The problem has been gradually improving since onset. The problem is controlled. Risk factors for coronary artery disease include diabetes mellitus, obesity, post-menopausal state and sedentary lifestyle. The current treatment provides moderate improvement. Compliance problems include exercise.      Past Medical History:  Diagnosis Date   Diabetes mellitus, type 2 ([place])    Dyslipidemia    Hyperlipidemia    Hypertension    Menorrhagia    fibroids   Obesity    Phlebitis    Varicose veins      Family History  Problem Relation Age of Onset   Breast cancer Mother    Hypertension Mother    Hypertension Father    Diabetes Father    Heart failure Father    Breast cancer Cousin    Stroke Brother    Heart failure  Brother      Current Outpatient Medications:    albuterol (VENTOLIN HFA) 108 (90 Base) MCG/ACT inhaler, Inhale 2 puffs into the lungs every 6 (six) hours as needed for wheezing or shortness of breath., Disp: 8 g, Rfl: 2   amLODipine (NORVASC) 10 MG tablet, TAKE 1 TABLET BY MOUTH EVERY DAY, Disp: 90 tablet, Rfl: 1   Ascorbic Acid (VITAMIN C) 1000 MG tablet, Take by mouth daily. , Disp: , Rfl:    aspirin 81 MG tablet, Take 81 mg by mouth daily., Disp: , Rfl:    B Complex Vitamins (B COMPLEX PO), Take by mouth., Disp: , Rfl:    Glucagon (GVOKE HYPOPEN 2-PACK) 1 MG/0.2ML SOAJ, Inject 77m when blood sugar is less that 60., Disp: 0.4 mL, Rfl: 2   metFORMIN (GLUCOPHAGE) 500 MG tablet, One tab po qd, Disp: 90 tablet, Rfl: 2   Multiple Vitamins-Minerals (MULTIVITAMIN PO), Take 1 tablet by mouth daily., Disp: , Rfl:    nystatin cream (MYCOSTATIN), Apply 1 application topically 2 times per day to affected area, Disp: 30 g, Rfl: 0   ONETOUCH ULTRA test strip, USE TO TEST SUGAR TWICE DAILY, Disp: 300 strip, Rfl: 2   progesterone (PROMETRIUM) 100 MG capsule, Take 50 mg by mouth at bedtime. Except Sundays, Disp: , Rfl:    rosuvastatin (CRESTOR) 10 MG tablet, TAKE 1 TABLET(10 MG) BY MOUTH DAILY, Disp: 90 tablet, Rfl: 1   telmisartan-hydrochlorothiazide (MICARDIS HCT) 40-12.5 MG  tablet, Take 1 tablet by mouth daily., Disp: 90 tablet, Rfl: 1   No Known Allergies   Review of Systems  Constitutional: Negative.   Respiratory: Negative.    Cardiovascular: Negative.   Gastrointestinal: Negative.   Neurological: Negative.   Psychiatric/Behavioral: Negative.       Today's Vitals   02/11/22 0846  BP: 110/78  Pulse: (!) 52  Temp: 98.5 F (36.9 C)  SpO2: 96%  Weight: 206 lb (93.4 kg)   Body mass index is 34.28 kg/m.  Wt Readings from Last 3 Encounters:  02/11/22 206 lb (93.4 kg)  11/05/21 205 lb 6.4 oz (93.2 kg)  10/07/21 203 lb 9.6 oz (92.4 kg)    Objective:  Physical Exam Vitals and nursing  note reviewed.  Constitutional:      Appearance: Normal appearance.  HENT:     Head: Normocephalic and atraumatic.     Nose:     Comments: Masked     Mouth/Throat:     Comments: Masked  Eyes:     Extraocular Movements: Extraocular movements intact.  Cardiovascular:     Rate and Rhythm: Normal rate and regular rhythm.     Heart sounds: Normal heart sounds.  Pulmonary:     Effort: Pulmonary effort is normal.     Breath sounds: Normal breath sounds.  Musculoskeletal:     Cervical back: Normal range of motion.  Skin:    General: Skin is warm.  Neurological:     General: No focal deficit present.     Mental Status: She is alert.  Psychiatric:        Mood and Affect: Mood normal.        Behavior: Behavior normal.      Assessment And Plan:     1. Dyslipidemia associated with type 2 diabetes mellitus ([place]) Comments: Chronic, she will c/w metformin for now. We discussed use of SGLT2-inh for renal/cardiac protection. I will check renal function at this time. F/u 4 months. - CMP14+EGFR - Hemoglobin A1c  2. Essential hypertension Comments: Chronic, well controlled.  She will c/w amlodipine 8m and telmisartan.  3. Class 1 obesity due to excess calories with serious comorbidity and body mass index (BMI) of 34.0 to 34.9 in adult Comments: She is encouraged to continue with current meds, while aiming for BMI<30 to decrease cardiac risk.    Patient was given opportunity to ask questions. Patient verbalized understanding of the plan and was able to repeat key elements of the plan. All questions were answered to their satisfaction.   I, RMaximino Greenland MD, have reviewed all documentation for this visit. The documentation on 02/11/22 for the exam, diagnosis, procedures, and orders are all accurate and complete.   IF YOU HAVE BEEN REFERRED TO A SPECIALIST, IT MAY TAKE 1-2 WEEKS TO SCHEDULE/PROCESS THE REFERRAL. IF YOU HAVE NOT HEARD FROM US/SPECIALIST IN TWO WEEKS, PLEASE GIVE [place] CALL  AT [phone].   THE PATIENT IS ENCOURAGED TO PRACTICE SOCIAL DISTANCING DUE TO THE COVID-19 PANDEMIC.

## 2022-02-19 ENCOUNTER — Ambulatory Visit
Admission: RE | Admit: 2022-02-19 | Discharge: 2022-02-19 | Disposition: A | Discharge status: Home / Self Care | Payer: Commercial Managed Care - PPO | Source: Ambulatory Visit | Attending: Internal Medicine | Admitting: Internal Medicine

## 2022-02-19 DIAGNOSIS — Z1231 Encounter for screening mammogram for malignant neoplasm of breast: Secondary | ICD-10-CM

## 2022-03-08 ENCOUNTER — Other Ambulatory Visit: Payer: Self-pay | Admitting: Internal Medicine

## 2022-03-22 ENCOUNTER — Other Ambulatory Visit: Payer: Self-pay | Admitting: Internal Medicine

## 2022-04-06 ENCOUNTER — Ambulatory Visit: Payer: Commercial Managed Care - PPO | Admitting: Internal Medicine

## 2022-04-06 ENCOUNTER — Encounter: Payer: Self-pay | Admitting: Internal Medicine

## 2022-04-06 VITALS — BP 118/80 | HR 62 | Temp 98.3°F | Ht 65.0 in | Wt 205.2 lb

## 2022-04-06 DIAGNOSIS — R0981 Nasal congestion: Secondary | ICD-10-CM | POA: Diagnosis not present

## 2022-04-06 DIAGNOSIS — Z6834 Body mass index (BMI) 34.0-34.9, adult: Secondary | ICD-10-CM

## 2022-04-06 DIAGNOSIS — J208 Acute bronchitis due to other specified organisms: Secondary | ICD-10-CM

## 2022-04-06 DIAGNOSIS — I10 Essential (primary) hypertension: Secondary | ICD-10-CM | POA: Diagnosis not present

## 2022-04-06 DIAGNOSIS — E1169 Type 2 diabetes mellitus with other specified complication: Secondary | ICD-10-CM | POA: Diagnosis not present

## 2022-04-06 DIAGNOSIS — E6609 Other obesity due to excess calories: Secondary | ICD-10-CM

## 2022-04-06 DIAGNOSIS — E785 Hyperlipidemia, unspecified: Secondary | ICD-10-CM

## 2022-04-06 MED ORDER — TRIAMCINOLONE ACETONIDE 40 MG/ML IJ SUSP
60.0000 mg | Freq: Once | INTRAMUSCULAR | Status: AC
  Administered 2022-04-06: 60 mg via INTRAMUSCULAR

## 2022-04-06 NOTE — Progress Notes (Signed)
I,Katelyn Schmidt,acting as a scribe for Katelyn Greenland, MD.,have documented all relevant documentation on the behalf of Katelyn Greenland, MD,as directed by  Katelyn Greenland, MD while in the presence of Katelyn Greenland, MD.   Subjective:     Patient ID: Katelyn Schmidt , female    DOB: 1961-01-31 , 62 y.o.   MRN: [id number]   Chief Complaint  Patient presents with   URI   Hypertension    HPI  Pt presents today for further evaluation of cough/cold symptoms. She states she has had a cold about two weeks. She has sinus congestion, sinus drainage and cough.  She has tried Delsym and other OTC meds. She has not had a fever, no known ill contacts.        Past Medical History:  Diagnosis Date   Diabetes mellitus, type 2 ([place])    Dyslipidemia    Hyperlipidemia    Hypertension    Menorrhagia    fibroids   Obesity    Phlebitis    Varicose veins      Family History  Problem Relation Age of Onset   Breast cancer Mother    Hypertension Mother    Hypertension Father    Diabetes Father    Heart failure Father    Breast cancer Cousin    Stroke Brother    Heart failure Brother      Current Outpatient Medications:    albuterol (VENTOLIN HFA) 108 (90 Base) MCG/ACT inhaler, Inhale 2 puffs into the lungs every 6 (six) hours as needed for wheezing or shortness of breath., Disp: 8 g, Rfl: 2   amLODipine (NORVASC) 10 MG tablet, TAKE 1 TABLET BY MOUTH EVERY DAY, Disp: 90 tablet, Rfl: 1   Ascorbic Acid (VITAMIN C) 1000 MG tablet, Take by mouth daily. , Disp: , Rfl:    aspirin 81 MG tablet, Take 81 mg by mouth daily., Disp: , Rfl:    B Complex Vitamins (B COMPLEX PO), Take by mouth., Disp: , Rfl:    Glucagon (GVOKE HYPOPEN 2-PACK) 1 MG/0.2ML SOAJ, Inject '1mg'$  when blood sugar is less that 60., Disp: 0.4 mL, Rfl: 2   metFORMIN (GLUCOPHAGE) 500 MG tablet, One tab po qd, Disp: 90 tablet, Rfl: 2   Multiple Vitamins-Minerals (MULTIVITAMIN PO), Take 1 tablet by mouth daily., Disp: , Rfl:     nystatin cream (MYCOSTATIN), Apply 1 application topically 2 times per day to affected area, Disp: 30 g, Rfl: 0   ONETOUCH ULTRA test strip, USE TO TEST SUGAR TWICE DAILY, Disp: 300 strip, Rfl: 2   progesterone (PROMETRIUM) 100 MG capsule, Take 50 mg by mouth at bedtime. Except Sundays, Disp: , Rfl:    rosuvastatin (CRESTOR) 10 MG tablet, TAKE 1 TABLET(10 MG) BY MOUTH DAILY, Disp: 90 tablet, Rfl: 1   telmisartan-hydrochlorothiazide (MICARDIS HCT) 40-12.5 MG tablet, Take 1 tablet by mouth daily., Disp: 90 tablet, Rfl: 1   No Known Allergies   Review of Systems  Constitutional: Negative.   HENT:  Positive for congestion.   Respiratory:  Positive for cough.   Cardiovascular: Negative.   Neurological: Negative.   Psychiatric/Behavioral: Negative.       Today's Vitals   04/06/22 1202  BP: 118/80  Pulse: 62  Temp: 98.3 F (36.8 C)  SpO2: 98%  Weight: 205 lb 3.2 oz (93.1 kg)  Height: '5\' 5"'$  (1.651 m)   Body mass index is 34.15 kg/m.  Wt Readings from Last 3 Encounters:  04/06/22 205 lb 3.2  oz (93.1 kg)  02/11/22 206 lb (93.4 kg)  11/05/21 205 lb 6.4 oz (93.2 kg)    Objective:  Physical Exam Vitals and nursing note reviewed.  Constitutional:      Appearance: Normal appearance. She is ill-appearing.  HENT:     Head: Normocephalic and atraumatic.     Right Ear: Tympanic membrane, ear canal and external ear normal. There is no impacted cerumen.     Left Ear: Tympanic membrane, ear canal and external ear normal. There is no impacted cerumen.     Nose:     Comments: Masked     Mouth/Throat:     Comments: Masked  Eyes:     Extraocular Movements: Extraocular movements intact.  Cardiovascular:     Rate and Rhythm: Normal rate and regular rhythm.     Heart sounds: Normal heart sounds.  Pulmonary:     Effort: Pulmonary effort is normal.     Breath sounds: Normal breath sounds.  Musculoskeletal:     Cervical back: Normal range of motion.  Skin:    General: Skin is warm.   Neurological:     General: No focal deficit present.     Mental Status: She is alert.  Psychiatric:        Mood and Affect: Mood normal.        Behavior: Behavior normal.         Assessment And Plan:     1. Acute viral bronchitis Comments: I will check resp panel, I will treat as indicated. She was given Kenalog, '40mg'$  IM x 1. Advised to avoid dairy. - Respiratory Panel w/ SARS-CoV2  2. Congestion of nasal sinus - Respiratory Panel w/ SARS-CoV2 - triamcinolone acetonide (KENALOG-40) injection 60 mg  3. Essential hypertension Comments: Chronic, controlled. She will c/w amlodipine '10mg'$  and telmisartan/hct 40/12.'5mg'$  daily. Encouraged to follow low sodium diet.  4. Dyslipidemia associated with type 2 diabetes mellitus ([place]) Comments: I will check labs as below. No med changes today. - CMP14+EGFR; Future - Hemoglobin A1c; Future  5. Class 1 obesity due to excess calories with serious comorbidity and body mass index (BMI) of 34.0 to 34.9 in adult She is encouraged to strive for BMI less than 30 to decrease cardiac risk. Advised to aim for at least 150 minutes of exercise per week.    Patient was given opportunity to ask questions. Patient verbalized understanding of the plan and was able to repeat key elements of the plan. All questions were answered to their satisfaction.   I, Katelyn Greenland, MD, have reviewed all documentation for this visit. The documentation on 04/20/22 for the exam, diagnosis, procedures, and orders are all accurate and complete.   IF YOU HAVE BEEN REFERRED TO A SPECIALIST, IT MAY TAKE 1-2 WEEKS TO SCHEDULE/PROCESS THE REFERRAL. IF YOU HAVE NOT HEARD FROM US/SPECIALIST IN TWO WEEKS, PLEASE GIVE [place] A CALL AT [phone].   THE PATIENT IS ENCOURAGED TO PRACTICE SOCIAL DISTANCING DUE TO THE COVID-19 PANDEMIC.

## 2022-04-06 NOTE — Patient Instructions (Signed)

## 2022-04-08 ENCOUNTER — Other Ambulatory Visit: Payer: Self-pay | Admitting: Internal Medicine

## 2022-04-08 DIAGNOSIS — J208 Acute bronchitis due to other specified organisms: Secondary | ICD-10-CM

## 2022-04-08 DIAGNOSIS — R053 Chronic cough: Secondary | ICD-10-CM

## 2022-04-08 LAB — RESPIRATORY PANEL W/ SARS-COV2

## 2022-04-09 ENCOUNTER — Ambulatory Visit
Admission: RE | Admit: 2022-04-09 | Discharge: 2022-04-09 | Disposition: A | Discharge status: Home / Self Care | Payer: Commercial Managed Care - PPO | Source: Ambulatory Visit | Attending: Internal Medicine | Admitting: Internal Medicine

## 2022-04-09 DIAGNOSIS — R053 Chronic cough: Secondary | ICD-10-CM

## 2022-04-13 ENCOUNTER — Encounter: Payer: Self-pay | Admitting: Internal Medicine

## 2022-04-22 ENCOUNTER — Other Ambulatory Visit: Payer: Commercial Managed Care - PPO

## 2022-04-22 DIAGNOSIS — E1169 Type 2 diabetes mellitus with other specified complication: Secondary | ICD-10-CM

## 2022-04-23 LAB — HEMOGLOBIN A1C
Est. average glucose Bld gHb Est-mCnc: 140 mg/dL
Hgb A1c MFr Bld: 6.5 % — ABNORMAL HIGH (ref 4.8–5.6)

## 2022-04-23 LAB — CMP14+EGFR
ALT: 18 IU/L (ref 0–32)
AST: 15 IU/L (ref 0–40)
Albumin/Globulin Ratio: 1.9 (ref 1.2–2.2)
Albumin: 4.1 g/dL (ref 3.8–4.9)
Alkaline Phosphatase: 88 IU/L (ref 44–121)
BUN/Creatinine Ratio: 18 (ref 9–23)
BUN: 15 mg/dL (ref 6–24)
Bilirubin Total: 0.4 mg/dL (ref 0.0–1.2)
CO2: 23 mmol/L (ref 20–29)
Calcium: 10.4 mg/dL — ABNORMAL HIGH (ref 8.7–10.2)
Chloride: 104 mmol/L (ref 96–106)
Creatinine, Ser: 0.85 mg/dL (ref 0.57–1.00)
Globulin, Total: 2.2 g/dL (ref 1.5–4.5)
Glucose: 94 mg/dL (ref 70–99)
Potassium: 4.2 mmol/L (ref 3.5–5.2)
Sodium: 142 mmol/L (ref 134–144)
Total Protein: 6.3 g/dL (ref 6.0–8.5)
eGFR: 79 mL/min/{1.73_m2} (ref >59)

## 2022-04-23 LAB — PTH, INTACT AND CALCIUM: PTH: 55 pg/mL (ref 15–65)

## 2022-04-26 ENCOUNTER — Encounter: Payer: Self-pay | Admitting: Internal Medicine

## 2022-05-19 ENCOUNTER — Other Ambulatory Visit: Payer: Self-pay

## 2022-05-19 DIAGNOSIS — E1169 Type 2 diabetes mellitus with other specified complication: Secondary | ICD-10-CM

## 2022-05-19 MED ORDER — METFORMIN HCL 500 MG PO TABS: ORAL_TABLET | ORAL | 2 refills | Status: DC

## 2022-06-12 ENCOUNTER — Other Ambulatory Visit: Payer: Self-pay | Admitting: Internal Medicine

## 2022-06-15 ENCOUNTER — Other Ambulatory Visit: Payer: Self-pay

## 2022-06-15 MED ORDER — TELMISARTAN-HCTZ 40-12.5 MG PO TABS: 1.0000 | ORAL_TABLET | Freq: Every day | ORAL | 1 refills | Status: AC

## 2022-06-22 LAB — HM DIABETES EYE EXAM

## 2022-07-20 ENCOUNTER — Encounter: Payer: Commercial Managed Care - PPO | Admitting: Internal Medicine

## 2022-07-20 ENCOUNTER — Encounter: Payer: Self-pay | Admitting: Internal Medicine

## 2022-07-21 ENCOUNTER — Encounter: Payer: Self-pay | Admitting: Internal Medicine

## 2022-07-21 ENCOUNTER — Ambulatory Visit (INDEPENDENT_AMBULATORY_CARE_PROVIDER_SITE_OTHER): Payer: Commercial Managed Care - PPO | Admitting: Internal Medicine

## 2022-07-21 VITALS — BP 118/82 | HR 56 | Temp 98.2°F | Ht 65.0 in | Wt 202.0 lb

## 2022-07-21 DIAGNOSIS — E785 Hyperlipidemia, unspecified: Secondary | ICD-10-CM | POA: Diagnosis not present

## 2022-07-21 DIAGNOSIS — Z0000 Encounter for general adult medical examination without abnormal findings: Secondary | ICD-10-CM

## 2022-07-21 DIAGNOSIS — E1169 Type 2 diabetes mellitus with other specified complication: Secondary | ICD-10-CM | POA: Diagnosis not present

## 2022-07-21 DIAGNOSIS — E6609 Other obesity due to excess calories: Secondary | ICD-10-CM

## 2022-07-21 DIAGNOSIS — I10 Essential (primary) hypertension: Secondary | ICD-10-CM

## 2022-07-21 DIAGNOSIS — Z6833 Body mass index (BMI) 33.0-33.9, adult: Secondary | ICD-10-CM

## 2022-07-21 DIAGNOSIS — E559 Vitamin D deficiency, unspecified: Secondary | ICD-10-CM

## 2022-07-21 LAB — POCT URINALYSIS DIPSTICK
Bilirubin, UA: NEGATIVE
Glucose, UA: NEGATIVE
Ketones, UA: NEGATIVE
Leukocytes, UA: NEGATIVE
Nitrite, UA: NEGATIVE
Protein, UA: NEGATIVE
Spec Grav, UA: 1.025 (ref 1.010–1.025)
Urobilinogen, UA: 0.2 E.U./dL
pH, UA: 6 (ref 5.0–8.0)

## 2022-07-21 NOTE — Patient Instructions (Signed)

## 2022-07-21 NOTE — Progress Notes (Signed)
I,[name],acting as a scribe for Gwynneth Aliment, MD.,have documented all relevant documentation on the behalf of Gwynneth Aliment, MD,as directed by  Gwynneth Aliment, MD while in the presence of Gwynneth Aliment, MD.   Subjective:     Patient ID: Katelyn Schmidt , female    DOB: 02/15/1961 , 62 y.o.   MRN: [id number]   Chief Complaint  Patient presents with   Annual Exam   Hypertension   Diabetes    HPI  Patient here for HM. She is no longer followed by GYN. She is s/p hysterectomy. She reports compliance with meds. She denies headaches, chest pain and shortness of breath. She states she is now exercising regularly.   She adds, recently having to bury her brother. She admits experiencing moments she feels down. She states handling emotions pretty well.   Hypertension This is a chronic problem. The current episode started more than 1 year ago. The problem has been gradually improving since onset. The problem is controlled. Pertinent negatives include no chest pain, headaches, palpitations or shortness of breath. Risk factors for coronary artery disease include diabetes mellitus, dyslipidemia, obesity and post-menopausal state. Past treatments include calcium channel blockers, angiotensin blockers and diuretics. The current treatment provides moderate improvement.  Diabetes She presents for her follow-up diabetic visit. She has type 2 diabetes mellitus. There are no hypoglycemic associated symptoms. Pertinent negatives for hypoglycemia include no headaches. There are no diabetic associated symptoms. Pertinent negatives for diabetes include no chest pain. Weakness: .There are no hypoglycemic complications. Symptoms are stable. Risk factors for coronary artery disease include diabetes mellitus, dyslipidemia, hypertension, post-menopausal and sedentary lifestyle. She is compliant with treatment most of the time. She participates in exercise intermittently. An ACE inhibitor/angiotensin II  receptor blocker is being taken. She does not see a podiatrist.Eye exam is current.     Past Medical History:  Diagnosis Date   Diabetes mellitus, type 2 ([facility])    Dyslipidemia    Hyperlipidemia    Hypertension    Menorrhagia    fibroids   Obesity    Phlebitis    Varicose veins      Family History  Problem Relation Age of Onset   Breast cancer Mother    Hypertension Mother    Hypertension Father    Diabetes Father    Heart failure Father    Breast cancer Cousin    Stroke Brother    Heart failure Brother      Current Outpatient Medications:    albuterol (VENTOLIN HFA) 108 (90 Base) MCG/ACT inhaler, Inhale 2 puffs into the lungs every 6 (six) hours as needed for wheezing or shortness of breath., Disp: 8 g, Rfl: 2   amLODipine (NORVASC) 10 MG tablet, TAKE 1 TABLET BY MOUTH EVERY DAY, Disp: 90 tablet, Rfl: 1   Ascorbic Acid (VITAMIN C) 1000 MG tablet, Take by mouth daily. , Disp: , Rfl:    aspirin 81 MG tablet, Take 81 mg by mouth daily., Disp: , Rfl:    B Complex Vitamins (B COMPLEX PO), Take by mouth., Disp: , Rfl:    Glucagon (GVOKE HYPOPEN 2-PACK) 1 MG/0.2ML SOAJ, Inject 1mg  when blood sugar is less that 60., Disp: 0.4 mL, Rfl: 2   metFORMIN (GLUCOPHAGE) 500 MG tablet, TAKE ONE TAB TWICE DAILY., Disp: 90 tablet, Rfl: 2   Multiple Vitamins-Minerals (MULTIVITAMIN PO), Take 1 tablet by mouth daily., Disp: , Rfl:    nystatin cream (MYCOSTATIN), Apply 1 application topically 2 times per  day to affected area, Disp: 30 g, Rfl: 0   ONETOUCH ULTRA test strip, USE TO TEST SUGAR TWICE DAILY, Disp: 300 strip, Rfl: 2   progesterone (PROMETRIUM) 100 MG capsule, Take 50 mg by mouth at bedtime. Except Sundays, Disp: , Rfl:    rosuvastatin (CRESTOR) 10 MG tablet, TAKE 1 TABLET(10 MG) BY MOUTH DAILY, Disp: 90 tablet, Rfl: 1   telmisartan-hydrochlorothiazide (MICARDIS HCT) 40-12.5 MG tablet, Take 1 tablet by mouth daily., Disp: 90 tablet, Rfl: 1   No Known Allergies    The patient states  she uses post menopausal status for birth control. Last LMP was No LMP recorded. Patient has had a hysterectomy.. Negative for Dysmenorrhea. Negative for: breast discharge, breast lump(s), breast pain and breast self exam. Associated symptoms include abnormal vaginal bleeding. Pertinent negatives include abnormal bleeding (hematology), anxiety, decreased libido, depression, difficulty falling sleep, dyspareunia, history of infertility, nocturia, sexual dysfunction, sleep disturbances, urinary incontinence, urinary urgency, vaginal discharge and vaginal itching. Diet regular.The patient states her exercise level is moderate.    . The patient's tobacco use is:  Social History   Tobacco Use  Smoking Status Never  Smokeless Tobacco Never  . She has been exposed to passive smoke. The patient's alcohol use is:  Social History   Substance and Sexual Activity  Alcohol Use Yes   Comment: occas 1x mo    Review of Systems  Constitutional: Negative.   HENT: Negative.    Eyes: Negative.   Respiratory: Negative.  Negative for shortness of breath.   Cardiovascular: Negative.  Negative for chest pain and palpitations.  Gastrointestinal: Negative.   Endocrine: Negative.   Genitourinary: Negative.   Musculoskeletal: Negative.   Skin: Negative.   Allergic/Immunologic: Negative.   Neurological: Negative.  Negative for headaches. Weakness: . Hematological: Negative.   Psychiatric/Behavioral: Negative.       Today's Vitals   07/21/22 1204  BP: 118/82  Pulse: (!) 56  Temp: 98.2 F (36.8 C)  SpO2: 98%  Weight: 202 lb (91.6 kg)  Height: 5\' 5"  (1.651 m)   Body mass index is 33.61 kg/m.  Wt Readings from Last 3 Encounters:  07/21/22 202 lb (91.6 kg)  04/06/22 205 lb 3.2 oz (93.1 kg)  02/11/22 206 lb (93.4 kg)    Objective:  Physical Exam Vitals and nursing note reviewed.  Constitutional:      Appearance: Normal appearance. She is obese.  HENT:     Head: Normocephalic and atraumatic.      Right Ear: Tympanic membrane, ear canal and external ear normal.     Left Ear: Tympanic membrane, ear canal and external ear normal.     Nose: Nose normal.     Mouth/Throat:     Mouth: Mucous membranes are moist.     Pharynx: Oropharynx is clear.  Eyes:     Extraocular Movements: Extraocular movements intact.     Conjunctiva/sclera: Conjunctivae normal.     Pupils: Pupils are equal, round, and reactive to light.  Cardiovascular:     Rate and Rhythm: Normal rate and regular rhythm.     Pulses: Normal pulses.          Dorsalis pedis pulses are 2+ on the right side and 2+ on the left side.     Heart sounds: Normal heart sounds.  Pulmonary:     Effort: Pulmonary effort is normal.     Breath sounds: Normal breath sounds.  Chest:  Breasts:    Tanner Score is 5.     Right:  Normal.     Left: Normal.  Abdominal:     General: Bowel sounds are normal.     Palpations: Abdomen is soft.  Genitourinary:    Comments: deferred Musculoskeletal:        General: Normal range of motion.     Cervical back: Normal range of motion and neck supple.  Feet:     Right foot:     Protective Sensation: 5 sites tested.  5 sites sensed.     Skin integrity: Dry skin present.     Toenail Condition: Right toenails are normal.     Left foot:     Protective Sensation: 5 sites tested.  5 sites sensed.     Skin integrity: Dry skin present.     Toenail Condition: Left toenails are normal.  Skin:    General: Skin is warm and dry.  Neurological:     General: No focal deficit present.     Mental Status: She is alert and oriented to person, place, and time.  Psychiatric:        Mood and Affect: Mood normal.        Behavior: Behavior normal.     Assessment And Plan:     1. Encounter for general adult medical examination w/o abnormal findings Comments: A full exam was performed. Importance of monthly self breast exams was discussed with the patient.  PATIENT IS ADVISED TO GET 30-45 MINUTES REGULAR EXERCISE NO  LESS THAN FOUR TO FIVE DAYS PER WEEK - BOTH WEIGHTBEARING EXERCISES AND AEROBIC ARE RECOMMENDED.  PATIENT IS ADVISED TO FOLLOW A HEALTHY DIET WITH AT LEAST SIX FRUITS/VEGGIES PER DAY, DECREASE INTAKE OF RED MEAT, AND TO INCREASE FISH INTAKE TO TWO DAYS PER WEEK.  MEATS/FISH SHOULD NOT BE FRIED, BAKED OR BROILED IS PREFERABLE.  IT IS ALSO IMPORTANT TO CUT BACK ON YOUR SUGAR INTAKE. PLEASE AVOID ANYTHING WITH ADDED SUGAR, CORN SYRUP OR OTHER SWEETENERS. IF YOU MUST USE A SWEETENER, YOU CAN TRY STEVIA. IT IS ALSO IMPORTANT TO AVOID ARTIFICIALLY SWEETENERS AND DIET BEVERAGES. LASTLY, I SUGGEST WEARING SPF 50 SUNSCREEN ON EXPOSED PARTS AND ESPECIALLY WHEN IN THE DIRECT SUNLIGHT FOR AN EXTENDED PERIOD OF TIME.  PLEASE AVOID FAST FOOD RESTAURANTS AND INCREASE YOUR WATER INTAKE. - CMP14+EGFR - Lipid panel - Hemoglobin A1c - CBC with Diff  2. Essential hypertension Comments: Chronic, fair control. Slight diastolic elevation noted. EKG performed, SB w/ voltage criteria for LVH. She will c/w telmisartan/hct and amlodipine daily. - POCT Urinalysis Dipstick (81002) - Microalbumin / Creatinine Urine Ratio - EKG 12-Lead  3. Dyslipidemia associated with type 2 diabetes mellitus ([facility]) Comments: Chronic, diabetic foot exam was performed. I plan to add Farxiga 10mg  once I have reviewed her labs. She will need f/u 4 wks after starting the medication. LDL goal < 70. She will c/w rosuvastatin. I DISCUSSED WITH THE PATIENT AT LENGTH REGARDING THE GOALS OF GLYCEMIC CONTROL AND POSSIBLE LONG-TERM COMPLICATIONS.  I  ALSO STRESSED THE IMPORTANCE OF COMPLIANCE WITH HOME GLUCOSE MONITORING, DIETARY RESTRICTIONS INCLUDING AVOIDANCE OF SUGARY DRINKS/PROCESSED FOODS,  ALONG WITH REGULAR EXERCISE.  I  ALSO STRESSED THE IMPORTANCE OF ANNUAL EYE EXAMS, SELF FOOT CARE AND COMPLIANCE WITH OFFICE VISITS.  - POCT Urinalysis Dipstick (81002) - Microalbumin / Creatinine Urine Ratio - EKG 12-Lead  4. Vitamin D deficiency disease Comments:  I will check a vitamin D level and supplement as needed. - Vitamin D (25 hydroxy)  5. Class 1 obesity due to excess calories with serious comorbidity and body  mass index (BMI) of 33.0 to 33.9 in adult Comments: She is encouraged to strive for BMI less than 30 to decrease cardiac risk. Advised to aim for at least 150 minutes of exercise per week.   Return in 4 weeks (on 08/18/2022), or Farxiga f/i, for 1 YEAR HM, 4 MONTH BP & DM . Patient was given opportunity to ask questions. Patient verbalized understanding of the plan and was able to repeat key elements of the plan. All questions were answered to their satisfaction.   I, Gwynneth Aliment, MD, have reviewed all documentation for this visit. The documentation on 07/21/22 for the exam, diagnosis, procedures, and orders are all accurate and complete.   THE PATIENT IS ENCOURAGED TO PRACTICE SOCIAL DISTANCING DUE TO THE COVID-19 PANDEMIC.

## 2022-07-22 LAB — CMP14+EGFR
ALT: 18 IU/L (ref 0–32)
AST: 15 IU/L (ref 0–40)
Albumin/Globulin Ratio: 1.7 (ref 1.2–2.2)
Albumin: 4.1 g/dL (ref 3.8–4.9)
Alkaline Phosphatase: 82 IU/L (ref 44–121)
BUN/Creatinine Ratio: 18 (ref 12–28)
BUN: 14 mg/dL (ref 8–27)
Bilirubin Total: 0.4 mg/dL (ref 0.0–1.2)
CO2: 24 mmol/L (ref 20–29)
Calcium: 10.7 mg/dL — ABNORMAL HIGH (ref 8.7–10.3)
Chloride: 103 mmol/L (ref 96–106)
Creatinine, Ser: 0.79 mg/dL (ref 0.57–1.00)
Globulin, Total: 2.4 g/dL (ref 1.5–4.5)
Glucose: 87 mg/dL (ref 70–99)
Potassium: 4.2 mmol/L (ref 3.5–5.2)
Sodium: 140 mmol/L (ref 134–144)
Total Protein: 6.5 g/dL (ref 6.0–8.5)
eGFR: 86 mL/min/{1.73_m2} (ref >59)

## 2022-07-22 LAB — MICROALBUMIN / CREATININE URINE RATIO
Creatinine, Urine: 66.7 mg/dL
Microalb/Creat Ratio: 32 mg/g creat — ABNORMAL HIGH (ref 0–29)
Microalbumin, Urine: 21.1 ug/mL

## 2022-07-22 LAB — LIPID PANEL
Chol/HDL Ratio: 2.8 ratio (ref 0.0–4.4)
Cholesterol, Total: 130 mg/dL (ref 100–199)
HDL: 46 mg/dL (ref >39)
LDL Chol Calc (NIH): 69 mg/dL (ref 0–99)
Triglycerides: 74 mg/dL (ref 0–149)
VLDL Cholesterol Cal: 15 mg/dL (ref 5–40)

## 2022-07-22 LAB — CBC WITH DIFFERENTIAL/PLATELET
Basophils Absolute: 0.1 10*3/uL (ref 0.0–0.2)
Basos: 1 %
EOS (ABSOLUTE): 0.2 10*3/uL (ref 0.0–0.4)
Eos: 4 %
Hematocrit: 43.7 % (ref 34.0–46.6)
Hemoglobin: 13.8 g/dL (ref 11.1–15.9)
Immature Grans (Abs): 0 10*3/uL (ref 0.0–0.1)
Immature Granulocytes: 1 %
Lymphocytes Absolute: 2.1 10*3/uL (ref 0.7–3.1)
Lymphs: 37 %
MCH: 25 pg — ABNORMAL LOW (ref 26.6–33.0)
MCHC: 31.6 g/dL (ref 31.5–35.7)
MCV: 79 fL (ref 79–97)
Monocytes Absolute: 0.6 10*3/uL (ref 0.1–0.9)
Monocytes: 10 %
Neutrophils Absolute: 2.6 10*3/uL (ref 1.4–7.0)
Neutrophils: 47 %
Platelets: 306 10*3/uL (ref 150–450)
RBC: 5.53 x10E6/uL — ABNORMAL HIGH (ref 3.77–5.28)
RDW: 14.6 % (ref 11.7–15.4)
WBC: 5.6 10*3/uL (ref 3.4–10.8)

## 2022-07-22 LAB — VITAMIN D 25 HYDROXY (VIT D DEFICIENCY, FRACTURES): Vit D, 25-Hydroxy: 38.1 ng/mL (ref 30.0–100.0)

## 2022-07-22 LAB — HEMOGLOBIN A1C
Est. average glucose Bld gHb Est-mCnc: 143 mg/dL
Hgb A1c MFr Bld: 6.6 % — ABNORMAL HIGH (ref 4.8–5.6)

## 2022-07-26 ENCOUNTER — Encounter: Payer: Self-pay | Admitting: Internal Medicine

## 2022-07-28 LAB — PROTEIN ELECTROPHORESIS
A/G Ratio: 1.2 (ref 0.7–1.7)
Albumin ELP: 3.6 g/dL (ref 2.9–4.4)
Alpha 1: 0.2 g/dL (ref 0.0–0.4)
Alpha 2: 0.9 g/dL (ref 0.4–1.0)
Beta: 1.1 g/dL (ref 0.7–1.3)
Gamma Globulin: 0.7 g/dL (ref 0.4–1.8)
Globulin, Total: 2.9 g/dL (ref 2.2–3.9)
Total Protein: 6.5 g/dL (ref 6.0–8.5)

## 2022-07-28 LAB — SPECIMEN STATUS REPORT

## 2022-08-17 ENCOUNTER — Ambulatory Visit: Payer: Commercial Managed Care - PPO | Admitting: Internal Medicine

## 2022-08-17 ENCOUNTER — Encounter: Payer: Self-pay | Admitting: Internal Medicine

## 2022-08-17 VITALS — BP 110/70 | HR 62 | Temp 98.5°F | Ht 65.0 in | Wt 205.4 lb

## 2022-08-17 DIAGNOSIS — E1169 Type 2 diabetes mellitus with other specified complication: Secondary | ICD-10-CM

## 2022-08-17 DIAGNOSIS — E785 Hyperlipidemia, unspecified: Secondary | ICD-10-CM

## 2022-08-17 DIAGNOSIS — N951 Menopausal and female climacteric states: Secondary | ICD-10-CM | POA: Diagnosis not present

## 2022-08-17 DIAGNOSIS — Z8709 Personal history of other diseases of the respiratory system: Secondary | ICD-10-CM

## 2022-08-17 MED ORDER — METFORMIN HCL 500 MG PO TABS
ORAL_TABLET | ORAL | 2 refills | Status: AC
Start: 2022-08-17 — End: ?

## 2022-08-17 MED ORDER — PROGESTERONE MILLED POWD: 2 refills | Status: AC

## 2022-08-17 MED ORDER — ALBUTEROL SULFATE HFA 108 (90 BASE) MCG/ACT IN AERS
2.0000 | INHALATION_SPRAY | Freq: Four times a day (QID) | RESPIRATORY_TRACT | 2 refills | Status: AC | PRN
Start: 2022-08-17 — End: ?

## 2022-08-17 MED ORDER — AMLODIPINE BESYLATE 10 MG PO TABS: 10.0000 mg | ORAL_TABLET | Freq: Every day | ORAL | 2 refills | Status: AC

## 2022-08-17 NOTE — Progress Notes (Signed)
I,Victoria T Deloria Lair, CMA,acting as a Neurosurgeon for Gwynneth Aliment, MD.,have documented all relevant documentation on the behalf of Gwynneth Aliment, MD,as directed by  Gwynneth Aliment, MD while in the presence of Gwynneth Aliment, MD.  Subjective:  Patient ID: Katelyn Schmidt , female    DOB: Apr 22, 1960 , 62 y.o.   MRN: [id number]  Chief Complaint  Patient presents with   Diabetes    HPI  Patient presents today for Farxiga follow up. However, despite the discussion we had at her last visit, she chose not to start the medication.  She decided not to take the medication because of the possible side effects.       Diabetes She presents for her follow-up diabetic visit. She has type 2 diabetes mellitus. There are no hypoglycemic complications. Symptoms are stable. Risk factors for coronary artery disease include diabetes mellitus, dyslipidemia, hypertension, post-menopausal and sedentary lifestyle. She is compliant with treatment most of the time. She participates in exercise intermittently. Her breakfast blood glucose is taken between 7-8 am. Her breakfast blood glucose range is generally 70-90 mg/dl. Eye exam is current.     Past Medical History:  Diagnosis Date   Diabetes mellitus, type 2 (HCC)    Dyslipidemia    Hyperlipidemia    Hypertension    Menorrhagia    fibroids   Obesity    Phlebitis    Varicose veins      Family History  Problem Relation Age of Onset   Breast cancer Mother    Hypertension Mother    Hypertension Father    Diabetes Father    Heart failure Father    Breast cancer Cousin    Stroke Brother    Heart failure Brother      Current Outpatient Medications:    Ascorbic Acid (VITAMIN C) 1000 MG tablet, Take by mouth daily. , Disp: , Rfl:    aspirin 81 MG tablet, Take 81 mg by mouth daily., Disp: , Rfl:    B Complex Vitamins (B COMPLEX PO), Take by mouth., Disp: , Rfl:    Multiple Vitamins-Minerals (MULTIVITAMIN PO), Take 1 tablet by mouth daily., Disp: ,  Rfl:    nystatin cream (MYCOSTATIN), Apply 1 application topically 2 times per day to affected area, Disp: 30 g, Rfl: 0   ONETOUCH ULTRA test strip, USE TO TEST SUGAR TWICE DAILY, Disp: 300 strip, Rfl: 2   Progesterone Milled POWD, Compounded prog SR 50mg  capsules nightly except Sundays, Disp: 90 g, Rfl: 2   rosuvastatin (CRESTOR) 10 MG tablet, TAKE 1 TABLET(10 MG) BY MOUTH DAILY, Disp: 90 tablet, Rfl: 1   telmisartan-hydrochlorothiazide (MICARDIS HCT) 40-12.5 MG tablet, Take 1 tablet by mouth daily., Disp: 90 tablet, Rfl: 1   albuterol (VENTOLIN HFA) 108 (90 Base) MCG/ACT inhaler, Inhale 2 puffs into the lungs every 6 (six) hours as needed for wheezing or shortness of breath., Disp: 18 g, Rfl: 2   amLODipine (NORVASC) 10 MG tablet, Take 1 tablet (10 mg total) by mouth daily., Disp: 90 tablet, Rfl: 2   metFORMIN (GLUCOPHAGE) 500 MG tablet, TAKE ONE TAB TWICE DAILY., Disp: 180 tablet, Rfl: 2   No Known Allergies   Review of Systems  Constitutional: Negative.   Respiratory: Negative.    Cardiovascular: Negative.   Gastrointestinal: Negative.   Musculoskeletal: Negative.   Neurological: Negative.   Psychiatric/Behavioral: Negative.       Today's Vitals   08/17/22 1537  BP: 110/70  Pulse: 62  Temp: 98.5 F (36.9  C)  SpO2: 98%  Weight: 205 lb 6.4 oz (93.2 kg)  Height: 5\' 5"  (1.651 m)   Body mass index is 34.18 kg/m.  Wt Readings from Last 3 Encounters:  08/17/22 205 lb 6.4 oz (93.2 kg)  07/21/22 202 lb (91.6 kg)  04/06/22 205 lb 3.2 oz (93.1 kg)    The 10-year ASCVD risk score (Arnett DK, et al., 2019) is: 7.7%   Values used to calculate the score:     Age: 62 years     Sex: Female     Is Non-Hispanic African American: Yes     Diabetic: Yes     Tobacco smoker: No     Systolic Blood Pressure: 110 mmHg     Is BP treated: Yes     HDL Cholesterol: 46 mg/dL     Total Cholesterol: 130 mg/dL  Objective:  Physical Exam Vitals and nursing note reviewed.  Constitutional:       Appearance: Normal appearance.  HENT:     Head: Normocephalic and atraumatic.  Eyes:     Extraocular Movements: Extraocular movements intact.  Cardiovascular:     Rate and Rhythm: Normal rate and regular rhythm.     Heart sounds: Normal heart sounds.  Pulmonary:     Effort: Pulmonary effort is normal.     Breath sounds: Normal breath sounds.  Musculoskeletal:     Cervical back: Normal range of motion.  Skin:    General: Skin is warm.  Neurological:     General: No focal deficit present.     Mental Status: She is alert.  Psychiatric:        Mood and Affect: Mood normal.        Behavior: Behavior normal.         Assessment And Plan:  1. Dyslipidemia associated with type 2 diabetes mellitus (HCC) Comments: Chronic, she will continue with metformin for now. Despite explanation of cardiac/renal benefits, pt declines starting [place] at this time.  She will f/u in 3 months for her next A1c check.  - metFORMIN (GLUCOPHAGE) 500 MG tablet; TAKE ONE TAB TWICE DAILY.  Dispense: 180 tablet; Refill: 2  2. 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 She reports it helps with her nerves/sleep issues.  3. History of bronchitis Comments: I will refill albuterol MDI as requested. - albuterol (VENTOLIN HFA) 108 (90 Base) MCG/ACT inhaler; Inhale 2 puffs into the lungs every 6 (six) hours as needed for wheezing or shortness of breath.  Dispense: 18 g; Refill: 2   Return if symptoms worsen or fail to improve.  Patient was given opportunity to ask questions. Patient verbalized understanding of the plan and was able to repeat key elements of the plan. All questions were answered to their satisfaction.   I, Gwynneth Aliment, MD, have reviewed all documentation for this visit. The documentation on 08/17/22 for the exam, diagnosis, procedures, and orders are all accurate and complete.   IF YOU HAVE BEEN REFERRED TO A SPECIALIST, IT  MAY TAKE 1-2 WEEKS TO SCHEDULE/PROCESS THE REFERRAL. IF YOU HAVE NOT HEARD FROM US/SPECIALIST IN TWO WEEKS, PLEASE GIVE [place] A CALL AT [phone].

## 2022-08-17 NOTE — Patient Instructions (Signed)

## 2022-10-26 LAB — BASIC METABOLIC PANEL
CO2: 27 — AB (ref 13–22)
Chloride: 104 (ref 99–108)
Creatinine: 0.8 (ref 0.5–1.1)
Glucose: 88
Potassium: 3.7 mEq/L (ref 3.5–5.1)
Sodium: 139 (ref 137–147)

## 2022-10-26 LAB — COMPREHENSIVE METABOLIC PANEL: eGFR: 81

## 2022-10-26 LAB — HEMOGLOBIN A1C: Hemoglobin A1C: 6.6

## 2022-11-03 ENCOUNTER — Encounter: Payer: Self-pay | Admitting: Internal Medicine

## 2022-11-04 ENCOUNTER — Ambulatory Visit (INDEPENDENT_AMBULATORY_CARE_PROVIDER_SITE_OTHER): Payer: Commercial Managed Care - PPO | Admitting: Internal Medicine

## 2022-11-04 ENCOUNTER — Other Ambulatory Visit: Payer: Self-pay

## 2022-11-04 ENCOUNTER — Encounter: Payer: Self-pay | Admitting: Internal Medicine

## 2022-11-04 ENCOUNTER — Other Ambulatory Visit: Payer: Self-pay | Admitting: Internal Medicine

## 2022-11-04 VITALS — BP 120/78 | HR 75 | Temp 98.5°F | Ht 65.0 in | Wt 203.2 lb

## 2022-11-04 DIAGNOSIS — E6609 Other obesity due to excess calories: Secondary | ICD-10-CM | POA: Diagnosis not present

## 2022-11-04 DIAGNOSIS — N644 Mastodynia: Secondary | ICD-10-CM | POA: Diagnosis not present

## 2022-11-04 DIAGNOSIS — E1169 Type 2 diabetes mellitus with other specified complication: Secondary | ICD-10-CM | POA: Diagnosis not present

## 2022-11-04 DIAGNOSIS — E66811 Obesity, class 1: Secondary | ICD-10-CM

## 2022-11-04 DIAGNOSIS — E785 Hyperlipidemia, unspecified: Secondary | ICD-10-CM | POA: Diagnosis not present

## 2022-11-04 DIAGNOSIS — Z6833 Body mass index (BMI) 33.0-33.9, adult: Secondary | ICD-10-CM

## 2022-11-04 DIAGNOSIS — Z7984 Long term (current) use of oral hypoglycemic drugs: Secondary | ICD-10-CM

## 2022-11-04 MED ORDER — ROSUVASTATIN CALCIUM 10 MG PO TABS: ORAL_TABLET | ORAL | 2 refills | Status: AC

## 2022-11-04 NOTE — Patient Instructions (Signed)

## 2022-11-04 NOTE — Progress Notes (Unsigned)
I,Victoria T Deloria Lair, CMA,acting as a Neurosurgeon for Gwynneth Aliment, MD.,have documented all relevant documentation on the behalf of Gwynneth Aliment, MD,as directed by  Gwynneth Aliment, MD while in the presence of Gwynneth Aliment, MD.  Subjective:  Patient ID: Katelyn Schmidt , female    DOB: December 11, 1960 , 62 y.o.   MRN: [id number]  Chief Complaint  Patient presents with   Diabetes   Hypertension    HPI  Patient presents today for bp & diabetes follow up. She reports compliance with medications. Denies headache, chest pain & sob.  She adds, experiencing left breast pain. She reports this initially started a couple of weeks ago. Denies any drainage. She would like a referral to the breast center for further testing.     Past Medical History:  Diagnosis Date   Diabetes mellitus, type 2 (HCC)    Dyslipidemia    Hyperlipidemia    Hypertension    Menorrhagia    fibroids   Obesity    Phlebitis    Varicose veins      Family History  Problem Relation Age of Onset   Breast cancer Mother    Hypertension Mother    Hypertension Father    Diabetes Father    Heart failure Father    Breast cancer Cousin    Stroke Brother    Heart failure Brother      Current Outpatient Medications:    albuterol (VENTOLIN HFA) 108 (90 Base) MCG/ACT inhaler, Inhale 2 puffs into the lungs every 6 (six) hours as needed for wheezing or shortness of breath., Disp: 18 g, Rfl: 2   amLODipine (NORVASC) 10 MG tablet, Take 1 tablet (10 mg total) by mouth daily., Disp: 90 tablet, Rfl: 2   Ascorbic Acid (VITAMIN C) 1000 MG tablet, Take by mouth daily. , Disp: , Rfl:    aspirin 81 MG tablet, Take 81 mg by mouth daily., Disp: , Rfl:    B Complex Vitamins (B COMPLEX PO), Take by mouth., Disp: , Rfl:    metFORMIN (GLUCOPHAGE) 500 MG tablet, TAKE ONE TAB TWICE DAILY., Disp: 180 tablet, Rfl: 2   Multiple Vitamins-Minerals (MULTIVITAMIN PO), Take 1 tablet by mouth daily., Disp: , Rfl:    nystatin cream (MYCOSTATIN), Apply  1 application topically 2 times per day to affected area, Disp: 30 g, Rfl: 0   ONETOUCH ULTRA test strip, USE TO TEST SUGAR TWICE DAILY, Disp: 300 strip, Rfl: 2   Progesterone Milled POWD, Compounded prog SR 50mg  capsules nightly except Sundays, Disp: 90 g, Rfl: 2   telmisartan-hydrochlorothiazide (MICARDIS HCT) 40-12.5 MG tablet, Take 1 tablet by mouth daily., Disp: 90 tablet, Rfl: 1   rosuvastatin (CRESTOR) 10 MG tablet, TAKE 1 TABLET(10 MG) BY MOUTH DAILY, Disp: 90 tablet, Rfl: 2   No Known Allergies   Review of Systems  Constitutional: Negative.   Respiratory: Negative.    Cardiovascular: Negative.   Gastrointestinal: Negative.   Neurological: Negative.   Psychiatric/Behavioral: Negative.       Today's Vitals   11/04/22 1224  BP: 120/78  Pulse: 75  Temp: 98.5 F (36.9 C)  SpO2: 98%  Weight: 203 lb 3.2 oz (92.2 kg)  Height: 5\' 5"  (1.651 m)   Body mass index is 33.81 kg/m.  Wt Readings from Last 3 Encounters:  11/04/22 203 lb 3.2 oz (92.2 kg)  08/17/22 205 lb 6.4 oz (93.2 kg)  07/21/22 202 lb (91.6 kg)     Objective:  Physical Exam Vitals and nursing  note reviewed.  Constitutional:      Appearance: Normal appearance.  HENT:     Head: Normocephalic and atraumatic.  Eyes:     Extraocular Movements: Extraocular movements intact.  Cardiovascular:     Rate and Rhythm: Normal rate and regular rhythm.     Heart sounds: Normal heart sounds.  Pulmonary:     Effort: Pulmonary effort is normal.     Breath sounds: Normal breath sounds.  Chest:       Comments: Tenderness to palpation Nodule palpated at area 1 Skin:    General: Skin is warm.  Neurological:     General: No focal deficit present.     Mental Status: She is alert.  Psychiatric:        Mood and Affect: Mood normal.        Behavior: Behavior normal.         Assessment And Plan:  Mastodynia of left breast  Dyslipidemia associated with type 2 diabetes mellitus (HCC)  Class 1 obesity due to excess  calories with serious comorbidity and body mass index (BMI) of 33.0 to 33.9 in adult  She is encouraged to strive for BMI less than 30 to decrease cardiac risk. Advised to aim for at least 150 minutes of exercise per week.   Return if symptoms worsen or fail to improve.  Patient was given opportunity to ask questions. Patient verbalized understanding of the plan and was able to repeat key elements of the plan. All questions were answered to their satisfaction.    I, Gwynneth Aliment, MD, have reviewed all documentation for this visit. The documentation on 11/04/22 for the exam, diagnosis, procedures, and orders are all accurate and complete.   IF YOU HAVE BEEN REFERRED TO A SPECIALIST, IT MAY TAKE 1-2 WEEKS TO SCHEDULE/PROCESS THE REFERRAL. IF YOU HAVE NOT HEARD FROM US/SPECIALIST IN TWO WEEKS, PLEASE GIVE [place] A CALL AT [phone].   THE PATIENT IS ENCOURAGED TO PRACTICE SOCIAL DISTANCING DUE TO THE COVID-19 PANDEMIC.

## 2022-11-09 DIAGNOSIS — N644 Mastodynia: Secondary | ICD-10-CM | POA: Insufficient documentation

## 2022-11-09 NOTE — Assessment & Plan Note (Signed)
She is encouraged to strive for BMI less than 30 to decrease cardiac risk. Advised to aim for at least 150 minutes of exercise per week.

## 2022-11-09 NOTE — Assessment & Plan Note (Signed)
I will refer her for diagnostic mammo of left breast along with u/s of left breast. She is in agreement with treatment plan. Advised to decrease her caffeine intake as well.

## 2022-11-09 NOTE — Assessment & Plan Note (Signed)
After reviewing her chart, I advised patient that I could see she had established care with another PCP.  Pt advised that she cannot have two PCPs. She established with an [facility] provider. Labs reviewed, no need to adjust meds. She denied having any dissatisfaction with her care; however, she was/is frustrated with refill requests. She states she must request meds multiple times before it is filled at the pharmacy. She admits that she had not discussed this frustration with me in the recent past. She will f/u with her new PCP for future visits.

## 2022-11-10 ENCOUNTER — Ambulatory Visit: Payer: Commercial Managed Care - PPO | Admitting: Family Medicine

## 2022-11-17 ENCOUNTER — Encounter: Payer: Commercial Managed Care - PPO | Admitting: Internal Medicine

## 2022-11-18 ENCOUNTER — Ambulatory Visit: Payer: Commercial Managed Care - PPO | Admitting: Internal Medicine

## 2022-11-18 ENCOUNTER — Other Ambulatory Visit: Payer: Self-pay | Admitting: Internal Medicine

## 2022-11-18 ENCOUNTER — Encounter: Payer: Self-pay | Admitting: Internal Medicine

## 2022-11-18 DIAGNOSIS — F4321 Adjustment disorder with depressed mood: Secondary | ICD-10-CM

## 2022-11-18 DIAGNOSIS — F4320 Adjustment disorder, unspecified: Secondary | ICD-10-CM

## 2022-11-30 ENCOUNTER — Ambulatory Visit
Admission: RE | Admit: 2022-11-30 | Discharge: 2022-11-30 | Disposition: A | Discharge status: Home / Self Care | Payer: Commercial Managed Care - PPO | Source: Ambulatory Visit | Attending: Internal Medicine | Admitting: Internal Medicine

## 2022-11-30 DIAGNOSIS — N644 Mastodynia: Secondary | ICD-10-CM

## 2022-12-08 ENCOUNTER — Other Ambulatory Visit: Payer: Commercial Managed Care - PPO

## 2023-01-10 ENCOUNTER — Other Ambulatory Visit: Payer: Self-pay | Admitting: Internal Medicine

## 2023-01-10 DIAGNOSIS — Z0000 Encounter for general adult medical examination without abnormal findings: Secondary | ICD-10-CM

## 2023-01-31 ENCOUNTER — Encounter: Payer: Self-pay | Admitting: Internal Medicine

## 2023-02-21 ENCOUNTER — Ambulatory Visit
Admission: RE | Admit: 2023-02-21 | Discharge: 2023-02-21 | Disposition: A | Discharge status: Home / Self Care | Payer: Commercial Managed Care - PPO | Source: Ambulatory Visit | Attending: Internal Medicine | Admitting: Internal Medicine

## 2023-02-21 ENCOUNTER — Ambulatory Visit: Payer: Commercial Managed Care - PPO

## 2023-02-21 DIAGNOSIS — Z0000 Encounter for general adult medical examination without abnormal findings: Secondary | ICD-10-CM

## 2023-06-03 ENCOUNTER — Encounter: Payer: Self-pay | Admitting: Internal Medicine

## 2023-07-22 ENCOUNTER — Other Ambulatory Visit: Payer: Self-pay | Admitting: Internal Medicine

## 2023-07-22 DIAGNOSIS — E1169 Type 2 diabetes mellitus with other specified complication: Secondary | ICD-10-CM

## 2023-07-26 ENCOUNTER — Encounter: Payer: Self-pay | Admitting: Internal Medicine

## 2024-01-03 ENCOUNTER — Other Ambulatory Visit: Payer: Self-pay | Admitting: Nurse Practitioner

## 2024-01-03 DIAGNOSIS — Z1231 Encounter for screening mammogram for malignant neoplasm of breast: Secondary | ICD-10-CM

## 2024-02-22 ENCOUNTER — Ambulatory Visit
Admission: RE | Admit: 2024-02-22 | Discharge: 2024-02-22 | Disposition: A | Discharge status: Home / Self Care | Source: Ambulatory Visit | Attending: Nurse Practitioner | Admitting: Nurse Practitioner

## 2024-02-22 DIAGNOSIS — Z1231 Encounter for screening mammogram for malignant neoplasm of breast: Secondary | ICD-10-CM
